# Patient Record
Sex: Male | Born: 1990 | Hispanic: No | Marital: Single | Smoking: Former smoker
Health system: Southern US, Community
[De-identification: ages and names within clinical notes are randomized; demographics above are authoritative.]

## PROBLEM LIST (undated history)

## (undated) DIAGNOSIS — F1010 Alcohol abuse, uncomplicated: Secondary | ICD-10-CM

## (undated) HISTORY — PX: EAR CANALOPLASTY: SHX1481

---
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
## 2014-01-20 ENCOUNTER — Emergency Department: Payer: Self-pay | Admitting: Emergency Medicine

## 2014-01-22 ENCOUNTER — Emergency Department: Payer: Self-pay | Admitting: Emergency Medicine

## 2014-01-25 ENCOUNTER — Emergency Department: Payer: Self-pay | Admitting: Emergency Medicine
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
## 2017-06-03 ENCOUNTER — Encounter: Payer: Self-pay | Admitting: Emergency Medicine

## 2017-06-03 ENCOUNTER — Emergency Department
Admission: EM | Admit: 2017-06-03 | Discharge: 2017-06-03 | Disposition: A | Discharge status: Home / Self Care | Payer: Self-pay | Attending: Emergency Medicine | Admitting: Emergency Medicine

## 2017-06-03 DIAGNOSIS — Y929 Unspecified place or not applicable: Secondary | ICD-10-CM | POA: Insufficient documentation

## 2017-06-03 DIAGNOSIS — R45851 Suicidal ideations: Secondary | ICD-10-CM

## 2017-06-03 DIAGNOSIS — T148XXA Other injury of unspecified body region, initial encounter: Secondary | ICD-10-CM

## 2017-06-03 DIAGNOSIS — F99 Mental disorder, not otherwise specified: Secondary | ICD-10-CM | POA: Insufficient documentation

## 2017-06-03 DIAGNOSIS — Y939 Activity, unspecified: Secondary | ICD-10-CM | POA: Insufficient documentation

## 2017-06-03 DIAGNOSIS — S0081XA Abrasion of other part of head, initial encounter: Secondary | ICD-10-CM | POA: Insufficient documentation

## 2017-06-03 DIAGNOSIS — Y999 Unspecified external cause status: Secondary | ICD-10-CM | POA: Insufficient documentation

## 2017-06-03 DIAGNOSIS — X58XXXA Exposure to other specified factors, initial encounter: Secondary | ICD-10-CM | POA: Insufficient documentation

## 2017-06-03 DIAGNOSIS — F17210 Nicotine dependence, cigarettes, uncomplicated: Secondary | ICD-10-CM | POA: Insufficient documentation

## 2017-06-03 LAB — CBC
HEMATOCRIT: 45.7 % (ref 40.0–52.0)
HEMOGLOBIN: 14.4 g/dL (ref 13.0–18.0)
MCH: 22.5 pg — ABNORMAL LOW (ref 26.0–34.0)
MCHC: 31.6 g/dL — AB (ref 32.0–36.0)
MCV: 71 fL — ABNORMAL LOW (ref 80.0–100.0)
Platelets: 154 10*3/uL (ref 150–440)
RBC: 6.43 MIL/uL — ABNORMAL HIGH (ref 4.40–5.90)
RDW: 14.9 % — ABNORMAL HIGH (ref 11.5–14.5)
WBC: 10.1 10*3/uL (ref 3.8–10.6)

## 2017-06-03 LAB — COMPREHENSIVE METABOLIC PANEL
ALBUMIN: 4.8 g/dL (ref 3.5–5.0)
ALT: 16 U/L — ABNORMAL LOW (ref 17–63)
ANION GAP: 4 — AB (ref 5–15)
AST: 20 U/L (ref 15–41)
Alkaline Phosphatase: 39 U/L (ref 38–126)
BUN: 18 mg/dL (ref 6–20)
CHLORIDE: 106 mmol/L (ref 101–111)
CO2: 26 mmol/L (ref 22–32)
Calcium: 9 mg/dL (ref 8.9–10.3)
Creatinine, Ser: 1.13 mg/dL (ref 0.61–1.24)
GFR calc non Af Amer: >60 mL/min (ref >60)
GLUCOSE: 110 mg/dL — AB (ref 65–99)
POTASSIUM: 4.2 mmol/L (ref 3.5–5.1)
SODIUM: 136 mmol/L (ref 135–145)
Total Bilirubin: 0.9 mg/dL (ref 0.3–1.2)
Total Protein: 8 g/dL (ref 6.5–8.1)

## 2017-06-03 LAB — URINE DRUG SCREEN, QUALITATIVE (ARMC ONLY)
AMPHETAMINES, UR SCREEN: NOT DETECTED
BARBITURATES, UR SCREEN: NOT DETECTED
BENZODIAZEPINE, UR SCRN: NOT DETECTED
COCAINE METABOLITE, UR ~~LOC~~: POSITIVE — AB
Cannabinoid 50 Ng, Ur ~~LOC~~: NOT DETECTED
MDMA (Ecstasy)Ur Screen: NOT DETECTED
METHADONE SCREEN, URINE: NOT DETECTED
OPIATE, UR SCREEN: NOT DETECTED
Phencyclidine (PCP) Ur S: NOT DETECTED
TRICYCLIC, UR SCREEN: NOT DETECTED

## 2017-06-03 LAB — ETHANOL: Alcohol, Ethyl (B): <5 mg/dL (ref <5)

## 2017-06-03 LAB — SALICYLATE LEVEL

## 2017-06-03 LAB — ACETAMINOPHEN LEVEL

## 2017-06-03 NOTE — ED Triage Notes (Addendum)
[facility]. Pt was a prisoner in jail and per the IVC paperwork pt was banging head on the door stating that he lost his son and was going to stop eating to kill himself. Pt has been in jail since Tuesday for damaging property.  Pt has hx of incarceration in the past.  Pt states he was banging his head on purpose today because he states he hated being in jail. At this time patient states he does not want to hurt himself.    Per the Officer that brought the patient to the jail the patient is being released from the jail so once the patient is discharged from the ED he is free to go home.
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
## 2017-06-03 NOTE — ED Notes (Signed)
Patient given meal tray and water. No further needs expressed at this time.

## 2017-06-03 NOTE — ED Notes (Signed)
Pt is alert and oriented on admission. Pt mood is sad but he is pleasant and cooperative with staff and appropriate on the unit. Pt denies SI/HI and AVH at this time. Pt states that he is glad to be out of jail and is looking forward to repairing his relationship with his girlfriend. SOC interviewed the patient and recommends discharge.Pt states that he will return to his parents house and is currently trying  to contact them for pick up this evening pending discharge. Nutrition provided and patient showered earlier as well. 15 minute checks are ongoing for safety.

## 2017-06-03 NOTE — ED Provider Notes (Addendum)
[facility] Emergency Department Provider Note  ____________________________________________   First MD Initiated Contact with Patient 06/03/17 1748     (approximate)  I have reviewed the triage vital signs and the nursing notes.   HISTORY  Chief Complaint Psychiatric Evaluation and Suicidal   HPI Warren Cherry is a 26 y.o. male who is presenting to the emergency department today after being brought in by 30s for banging his head against a wall in saying that he has nothing to live for. The patient admits to this but says that he was only saying that he had nothing to live for so that he will be taken as general population in the jail. He says that his son was recently taken by his "baby's mother." Patient denying any suicidal or homicidal ideation at this time.Sustain a small abrasion to his anterior and superior forehead. Denies any headache, nausea or vomiting or dizziness at this time. Says that his last tetanus shot was within the past 5 years. History reviewed. No pertinent past medical history.  There are no active problems to display for this patient.   History reviewed. No pertinent surgical history.  Prior to Admission medications   Not on File    Allergies Patient has no known allergies.  History reviewed. No pertinent family history.  Social History Social History  Substance Use Topics  . Smoking status: Current Every Day Smoker    Packs/day: 0.50    Types: Cigarettes  . Smokeless tobacco: Never Used  . Alcohol use Yes    Review of Systems  Constitutional: No fever/chills Eyes: No visual changes. ENT: No sore throat. Cardiovascular: Denies chest pain. Respiratory: Denies shortness of breath. Gastrointestinal: No abdominal pain.  No nausea, no vomiting.  No diarrhea.  No constipation. Genitourinary: Negative for dysuria. Musculoskeletal: Negative for back pain. Skin: Negative for rash. Neurological: Negative for headaches,  focal weakness or numbness.   ____________________________________________   PHYSICAL EXAM:  VITAL SIGNS: ED Triage Vitals [06/03/17 1737]  Enc Vitals Group     BP      Pulse      Resp      Temp      Temp src      SpO2      Weight      Height      Head Circumference      Peak Flow      Pain Score 0     Pain Loc      Pain Edu?      Excl. in GC?     Constitutional: Alert and oriented. Well appearing and in no acute distress. Eyes: Conjunctivae are normal.  Head: Superficial abrasion to the superior aspect of the forehead at the midline. No active bleeding at this time. Abrasion is about 2 x 3 cm. Nose: No congestion/rhinnorhea. Mouth/Throat: Mucous membranes are moist.  Neck: No stridor.   Cardiovascular: Normal rate, regular rhythm. Grossly normal heart sounds.  Good peripheral circulation. Respiratory: Normal respiratory effort.  No retractions. Lungs CTAB. Gastrointestinal: Soft and nontender. No distention.  Musculoskeletal: No lower extremity tenderness nor edema.  No joint effusions. Neurologic:  Normal speech and language. No gross focal neurologic deficits are appreciated. Skin:  Skin is warm, dry and intact. No rash noted. Psychiatric: Mood and affect are normal. Speech and behavior are normal.  ____________________________________________   LABS (all labs ordered are listed, but only abnormal results are displayed)  Labs Reviewed  COMPREHENSIVE METABOLIC PANEL - Abnormal; Notable for the  following:       Result Value   Glucose, Bld 110 (*)    ALT 16 (*)    Anion gap 4 (*)    All other components within normal limits  ACETAMINOPHEN LEVEL - Abnormal; Notable for the following:    Acetaminophen (Tylenol), Serum <10 (*)    All other components within normal limits  CBC - Abnormal; Notable for the following:    RBC 6.43 (*)    MCV 71.0 (*)    MCH 22.5 (*)    MCHC 31.6 (*)    RDW 14.9 (*)    All other components within normal limits  URINE DRUG SCREEN,  QUALITATIVE ([facility] ONLY) - Abnormal; Notable for the following:    Cocaine Metabolite,Ur [place] POSITIVE (*)    All other components within normal limits  ETHANOL  SALICYLATE LEVEL   ____________________________________________  EKG   ____________________________________________  RADIOLOGY   ____________________________________________   PROCEDURES  Procedure(s) performed:   Procedures  Critical Care performed:   ____________________________________________   INITIAL IMPRESSION / ASSESSMENT AND PLAN / ED COURSE  Pertinent labs & imaging results that were available during my care of the patient were reviewed by me and considered in my medical decision making (see chart for details).  Patient, cooperative at this time. I we'll uphold the patient's involuntary committed. To be seen by the specialist on-call psychiatrist. The patient is understanding of the plan and willing to comply.    ----------------------------------------- 10:51 PM on 06/03/2017 -----------------------------------------  Patient evaluated by specialist on call and deemed appropriate for discharge. The patient has been calm and cooperative while in the emergency department. Patient not in police custody. Cocaine positive the patient is not intoxicated clinically.  ____________________________________________   FINAL CLINICAL IMPRESSION(S) / ED DIAGNOSES  Abrasion. Suicidal ideation.    NEW MEDICATIONS STARTED DURING THIS VISIT:  New Prescriptions   No medications on file     Note:  This document was prepared using Dragon voice recognition software and may include unintentional dictation errors.     Myrna Blazer, MD 06/03/17 1936    Myrna Blazer, MD 06/03/17 2252  No further expression of any suicidal or homicidal ideation.   Myrna Blazer, MD 06/03/17 2252

## 2019-05-29 ENCOUNTER — Other Ambulatory Visit
Admit: 2019-05-29 | Discharge: 2019-05-29 | Disposition: A | Discharge status: Home / Self Care | Attending: Family Medicine | Admitting: Family Medicine
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
## 2019-10-15 ENCOUNTER — Other Ambulatory Visit: Payer: Self-pay

## 2019-10-15 ENCOUNTER — Emergency Department
Admission: EM | Admit: 2019-10-15 | Discharge: 2019-10-15 | Disposition: A | Discharge status: Home / Self Care | Attending: Emergency Medicine | Admitting: Emergency Medicine

## 2019-10-15 DIAGNOSIS — H65112 Acute and subacute allergic otitis media (mucoid) (sanguinous) (serous), left ear: Secondary | ICD-10-CM | POA: Insufficient documentation

## 2019-10-15 DIAGNOSIS — Z20822 Contact with and (suspected) exposure to covid-19: Secondary | ICD-10-CM

## 2019-10-15 DIAGNOSIS — F17210 Nicotine dependence, cigarettes, uncomplicated: Secondary | ICD-10-CM | POA: Insufficient documentation

## 2019-10-15 DIAGNOSIS — H6500 Acute serous otitis media, unspecified ear: Secondary | ICD-10-CM

## 2019-10-15 MED ORDER — AMOXICILLIN 500 MG PO CAPS: 500.0000 mg | ORAL_CAPSULE | Freq: Three times a day (TID) | ORAL | 0 refills | Status: DC

## 2019-10-15 NOTE — ED Triage Notes (Signed)
Pt c/o body aches, HA,chills for the past 2 days.

## 2019-10-15 NOTE — Discharge Instructions (Signed)
Follow-up with your regular doctor if not better in 3 days.  Return emergency department worsening.  Your Covid results should return within 48 hours.  Stay quarantined until you have received your results.  If negative you may return to your daily activities the next day.  If positive you must quarantine for an additional 10 days.

## 2019-10-15 NOTE — ED Notes (Addendum)
See triage note  Presents with h/a and body aches  States sxs' started 2 days ago   Afebrile on arrival

## 2019-10-15 NOTE — ED Provider Notes (Signed)
[facility] Emergency Department Provider Note  ____________________________________________   First MD Initiated Contact with Patient 10/15/19 1722     (approximate)  I have reviewed the triage vital signs and the nursing notes.   HISTORY  Chief Complaint URI    HPI Warren Cherry is a 28 y.o. male presents emergency department complaint headache, body aches for 2 days.  He is also complaining of  left ear pain.  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  States he feels like his become infected.  Unsure if he has been exposed to Covid.  Other family members are here with same symptoms.  He denies any chest pain, shortness of breath, abdominal pain.   History reviewed. No pertinent past medical history.  There are no active problems to display for this patient.   Past Surgical History:  Procedure Laterality Date  . EAR CANALOPLASTY      Prior to Admission medications   Medication Sig Start Date End Date Taking? Authorizing Provider  amoxicillin (AMOXIL) 500 MG capsule Take 1 capsule (500 mg total) by mouth 3 (three) times daily. 10/15/19   Versie Starks, PA-C    Allergies Patient has no known allergies.  No family history on file.  Social History Social History   Tobacco Use  . Smoking status: Current Every Day Smoker    Packs/day: 0.50    Types: Cigarettes  . Smokeless tobacco: Never Used  Substance Use Topics  . Alcohol use: Yes  . Drug use: No    Review of Systems  Constitutional: Positive fever/chills and body aches Eyes: No visual changes. ENT: No sore throat.  Positive for left ear pain Respiratory: Denies cough Genitourinary: Negative for dysuria. Musculoskeletal: Negative for back pain. Skin: Negative for rash.    ____________________________________________   PHYSICAL EXAM:  VITAL SIGNS: ED Triage Vitals  Enc Vitals Group     BP 10/15/19 1704 140/82     Pulse Rate 10/15/19  1704 94     Resp 10/15/19 1704 16     Temp 10/15/19 1704 98.9 F (37.2 C)     Temp Source 10/15/19 1704 Oral     SpO2 10/15/19 1704 98 %     Weight 10/15/19 1706 140 lb (63.5 kg)     Height 10/15/19 1706 5\' 6"  (1.676 m)     Head Circumference --      Peak Flow --      Pain Score 10/15/19 1705 10     Pain Loc --      Pain Edu? --      Excl. in [place]? --     Constitutional: Alert and oriented. Well appearing and in no acute distress. Eyes: Conjunctivae are normal.  Head: Atraumatic. Ears: Left TM is red Nose: No congestion/rhinnorhea. Mouth/Throat: Mucous membranes are moist.   Neck:  supple no lymphadenopathy noted Cardiovascular: Normal rate, regular rhythm. Heart sounds are normal Respiratory: Normal respiratory effort.  No retractions, lungs c t a  GU: deferred Musculoskeletal: FROM all extremities, warm and well perfused Neurologic:  Normal speech and language.  Skin:  Skin is warm, dry and intact. No rash noted. Psychiatric: Mood and affect are normal. Speech and behavior are normal.  ____________________________________________   LABS (all labs ordered are listed, but only abnormal results are displayed)  Labs Reviewed  SARS CORONAVIRUS 2 (TAT 6-24 HRS)   ____________________________________________   ____________________________________________  RADIOLOGY    ____________________________________________   PROCEDURES  Procedure(s) performed:  No  Procedures    ____________________________________________   INITIAL IMPRESSION / ASSESSMENT AND PLAN / ED COURSE  Pertinent labs & imaging results that were available during my care of the patient were reviewed by me and considered in my medical decision making (see chart for details).   Patient is a 28 year old male presents emergency department with complaints of left ear pain, fever chills and body aches.  Physical exam shows a well adult.  Left TM is red.  Explained findings to the patient.  He was  given a prescription for amoxicillin for the left ear.  However still did a Covid test as multiple family members have same symptoms.  He was given discharge instructions and told to quarantine until Covid results have been given to him.  He was discharged in stable condition.    Warren Cherry was evaluated in Emergency Department on 10/15/2019 for the symptoms described in the history of present illness. He was evaluated in the context of the global COVID-19 pandemic, which necessitated consideration that the patient might be at risk for infection with the SARS-CoV-2 virus that causes COVID-19. Institutional protocols and algorithms that pertain to the evaluation of patients at risk for COVID-19 are in a state of rapid change based on information released by regulatory bodies including the CDC and federal and state organizations. These policies and algorithms were followed during the patient's care in the ED.   As part of my medical decision making, I reviewed the following data within the electronic MEDICAL RECORD NUMBER Nursing notes reviewed and incorporated, Old chart reviewed, Notes from prior ED visits and [place] Controlled Substance Database  ____________________________________________   FINAL CLINICAL IMPRESSION(S) / ED DIAGNOSES  Final diagnoses:  Suspected COVID-19 virus infection  Acute serous otitis media, recurrence not specified, unspecified laterality      NEW MEDICATIONS STARTED DURING THIS VISIT:  New Prescriptions   AMOXICILLIN (AMOXIL) 500 MG CAPSULE    Take 1 capsule (500 mg total) by mouth 3 (three) times daily.     Note:  This document was prepared using Dragon voice recognition software and may include unintentional dictation errors.    Faythe Ghee, PA-C 10/15/19 Johna Sheriff, MD 10/18/19 [phone]

## 2019-10-18 NOTE — ED Notes (Signed)
Called pt to

## 2022-09-12 ENCOUNTER — Encounter: Payer: Self-pay | Admitting: Emergency Medicine

## 2022-09-12 ENCOUNTER — Ambulatory Visit
Admission: EM | Admit: 2022-09-12 | Discharge: 2022-09-12 | Disposition: A | Discharge status: Home / Self Care | Payer: Self-pay | Attending: Physician Assistant | Admitting: Physician Assistant

## 2022-09-12 DIAGNOSIS — H6002 Abscess of left external ear: Secondary | ICD-10-CM

## 2022-09-12 MED ORDER — DOXYCYCLINE HYCLATE 100 MG PO CAPS: 100.0000 mg | ORAL_CAPSULE | Freq: Two times a day (BID) | ORAL | 0 refills | Status: AC

## 2022-09-12 NOTE — Discharge Instructions (Signed)
-  I have drained the abscess of your earlobe.  Make sure you change your bandage later today.  I expect that you may still experience more drainage from the area over the next day or 2.  Change your bandage daily and clean the area with soap and water.  May be able to cover with a regular Band-Aid tomorrow. - I sent oral antibiotics to pharmacy to clear up the remainder of the infection.  If you develop a fever or your symptoms worsen or there are not improved over the next couple days you should return for reevaluation.

## 2022-09-12 NOTE — ED Provider Notes (Signed)
[facility]    CSN: [id number] Arrival date & time: 09/12/22  1130      History   Chief Complaint Chief Complaint  Patient presents with   Abscess    HPI Warren Cherry is a 31 y.o. male presenting for an area of swelling and redness as well as pain behind the left ear for the past 3 to 4 days.  He reports some swelling in his jaw as well.  He denies any fevers or fatigue.  He has not had any drainage from the area but says that it smells.  He has never had the like this before.  He says only his external ear is painful.  He denies any earrings or piercings.  Has not been treating condition in any way and it continues to enlarge.  HPI  History reviewed. No pertinent past medical history.  There are no problems to display for this patient.   Past Surgical History:  Procedure Laterality Date   EAR CANALOPLASTY         Home Medications    Prior to Admission medications   Medication Sig Start Date End Date Taking? Authorizing Provider  doxycycline (VIBRAMYCIN) 100 MG capsule Take 1 capsule (100 mg total) by mouth 2 (two) times daily for 7 days. 09/12/22 09/19/22 Yes Shirlee Latch, PA-C    Family History No family history on file.  Social History Social History   Tobacco Use   Smoking status: Former    Packs/day: 0.50    Types: Cigarettes   Smokeless tobacco: Never  Vaping Use   Vaping Use: Every day  Substance Use Topics   Alcohol use: Yes   Drug use: No     Allergies   Patient has no known allergies.   Review of Systems Review of Systems  Constitutional:  Negative for fatigue and fever.  HENT:  Positive for ear pain and facial swelling. Negative for ear discharge and hearing loss.   Skin:  Positive for color change. Negative for wound.  Neurological:  Negative for weakness and headaches.     Physical Exam Triage Vital Signs ED Triage Vitals  Enc Vitals Group     BP 09/12/22 1204 136/87     Pulse Rate 09/12/22 1204 75     Resp  09/12/22 1204 16     Temp 09/12/22 1204 98.6 F (37 C)     Temp Source 09/12/22 1204 Oral     SpO2 09/12/22 1204 98 %     Weight 09/12/22 1203 139 lb 15.9 oz (63.5 kg)     Height 09/12/22 1203 5\' 6"  (1.676 m)     Head Circumference --      Peak Flow --      Pain Score 09/12/22 1202 6     Pain Loc --      Pain Edu? --      Excl. in GC? --    No data found.  Updated Vital Signs BP 136/87 (BP Location: Right Arm)   Pulse 75   Temp 98.6 F (37 C) (Oral)   Resp 16   Ht 5\' 6"  (1.676 m)   Wt 139 lb 15.9 oz (63.5 kg)   SpO2 98%   BMI 22.60 kg/m     Physical Exam Vitals and nursing note reviewed.  Constitutional:      General: He is not in acute distress.    Appearance: Normal appearance. He is well-developed. He is not ill-appearing.  HENT:     Head:  Normocephalic and atraumatic.     Right Ear: Tympanic membrane, ear canal and external ear normal.     Left Ear: Tympanic membrane and ear canal normal.     Ears:     Comments: See image below.  There is an area of erythema edema, swelling, induration and fluctuance of the left earlobe and posterior to that.  It is approximately 2 cm x 2 cm.    Nose: Nose normal.     Mouth/Throat:     Mouth: Mucous membranes are moist.     Pharynx: Oropharynx is clear.  Eyes:     General: No scleral icterus.    Conjunctiva/sclera: Conjunctivae normal.  Cardiovascular:     Rate and Rhythm: Normal rate and regular rhythm.     Heart sounds: Normal heart sounds.  Pulmonary:     Effort: Pulmonary effort is normal. No respiratory distress.     Breath sounds: Normal breath sounds.  Musculoskeletal:     Cervical back: Neck supple.  Skin:    General: Skin is warm and dry.     Capillary Refill: Capillary refill takes less than 2 seconds.  Neurological:     General: No focal deficit present.     Mental Status: He is alert. Mental status is at baseline.  Psychiatric:        Mood and Affect: Mood normal.        Behavior: Behavior normal.        UC Treatments / Results  Labs (all labs ordered are listed, but only abnormal results are displayed) Labs Reviewed - No data to display  EKG   Radiology No results found.  Procedures Incision and Drainage  Date/Time: 09/12/2022 1:12 PM  Performed by: Danton Clap, PA-C Authorized by: Danton Clap, PA-C   Consent:    Consent obtained:  Verbal   Consent given by:  Patient   Risks discussed:  Bleeding, infection, incomplete drainage and pain   Alternatives discussed:  Alternative treatment, delayed treatment and observation Location:    Type:  Abscess   Size:  2 cm x 2 cm   Location:  Head   Head location:  L external ear Pre-procedure details:    Skin preparation:  Povidone-iodine Anesthesia:    Anesthesia method:  Local infiltration   Local anesthetic:  Lidocaine 1% w/o epi Procedure type:    Complexity:  Simple Procedure details:    Incision types:  Stab incision   Drainage:  Purulent   Drainage amount:  Moderate   Wound treatment:  Wound left open Post-procedure details:    Procedure completion:  Tolerated well, no immediate complications  (including critical care time)  Medications Ordered in UC Medications - No data to display  Initial Impression / Assessment and Plan / UC Course  I have reviewed the triage vital signs and the nursing notes.  Pertinent labs & imaging results that were available during my care of the patient were reviewed by me and considered in my medical decision making (see chart for details).   31 year old male presents for abscess of the left earlobe.  I have included an image of the abscess which is about 2 cm x 2 cm.  There is also a little bit of swelling posterior to the ear which is consistent with posterior auricular enlarged lymph node.  The area is diffusely tender.  He has mild swelling of the left jaw.  Unable to fully open mouth.  No fevers.  He agrees to I&D of the  abscess.  I was able to express a moderate  amount of foul-smelling purulent material.  Cleaned the area bandaged with gauze.  He tolerated this very well.  Sent doxycycline 100 mg twice daily x7 days to pharmacy.  Discussed wound care.  Reviewed return precautions.   Final Clinical Impressions(s) / UC Diagnoses   Final diagnoses:  Abscess of left earlobe     Discharge Instructions      -I have drained the abscess of your earlobe.  Make sure you change your bandage later today.  I expect that you may still experience more drainage from the area over the next day or 2.  Change your bandage daily and clean the area with soap and water.  May be able to cover with a regular Band-Aid tomorrow. - I sent oral antibiotics to pharmacy to clear up the remainder of the infection.  If you develop a fever or your symptoms worsen or there are not improved over the next couple days you should return for reevaluation.     ED Prescriptions     Medication Sig Dispense Auth. Provider   doxycycline (VIBRAMYCIN) 100 MG capsule Take 1 capsule (100 mg total) by mouth 2 (two) times daily for 7 days. 14 capsule Shirlee Latch, PA-C      PDMP not reviewed this encounter.   Shirlee Latch, PA-C 09/12/22 1314

## 2022-09-12 NOTE — ED Triage Notes (Addendum)
Pt has abscess on his left ear. He states he noticed it earlier this week. He also has swelling of his left jaw area. No drainage. He states he also has a headache. Denies fever.

## 2023-09-12 ENCOUNTER — Inpatient Hospital Stay
Admission: AD | Admit: 2023-09-12 | Discharge: 2023-09-16 | DRG: 897 | Disposition: A | Discharge status: Home / Self Care | Payer: BC Managed Care – PPO | Source: Intra-hospital | Attending: Psychiatry | Admitting: Psychiatry

## 2023-09-12 ENCOUNTER — Encounter (HOSPITAL_COMMUNITY): Payer: Self-pay | Admitting: *Deleted

## 2023-09-12 ENCOUNTER — Encounter: Payer: Self-pay | Admitting: Nurse Practitioner

## 2023-09-12 ENCOUNTER — Emergency Department (HOSPITAL_COMMUNITY)
Admission: EM | Admit: 2023-09-12 | Discharge: 2023-09-12 | Disposition: A | Discharge status: Other Acute Inpatient Hospital | Payer: BC Managed Care – PPO | Attending: Emergency Medicine | Admitting: Emergency Medicine

## 2023-09-12 ENCOUNTER — Other Ambulatory Visit: Payer: Self-pay

## 2023-09-12 DIAGNOSIS — G4700 Insomnia, unspecified: Secondary | ICD-10-CM | POA: Diagnosis not present

## 2023-09-12 DIAGNOSIS — F418 Other specified anxiety disorders: Secondary | ICD-10-CM | POA: Insufficient documentation

## 2023-09-12 DIAGNOSIS — Z8249 Family history of ischemic heart disease and other diseases of the circulatory system: Secondary | ICD-10-CM

## 2023-09-12 DIAGNOSIS — R45851 Suicidal ideations: Secondary | ICD-10-CM | POA: Diagnosis present

## 2023-09-12 DIAGNOSIS — F4310 Post-traumatic stress disorder, unspecified: Secondary | ICD-10-CM | POA: Diagnosis present

## 2023-09-12 DIAGNOSIS — F1410 Cocaine abuse, uncomplicated: Secondary | ICD-10-CM | POA: Diagnosis present

## 2023-09-12 DIAGNOSIS — R7989 Other specified abnormal findings of blood chemistry: Secondary | ICD-10-CM | POA: Diagnosis present

## 2023-09-12 DIAGNOSIS — R4589 Other symptoms and signs involving emotional state: Secondary | ICD-10-CM | POA: Diagnosis present

## 2023-09-12 DIAGNOSIS — R040 Epistaxis: Secondary | ICD-10-CM | POA: Diagnosis present

## 2023-09-12 DIAGNOSIS — R45850 Homicidal ideations: Secondary | ICD-10-CM

## 2023-09-12 DIAGNOSIS — Z87891 Personal history of nicotine dependence: Secondary | ICD-10-CM | POA: Diagnosis not present

## 2023-09-12 DIAGNOSIS — E871 Hypo-osmolality and hyponatremia: Secondary | ICD-10-CM | POA: Diagnosis not present

## 2023-09-12 DIAGNOSIS — R000 Tachycardia, unspecified: Secondary | ICD-10-CM | POA: Insufficient documentation

## 2023-09-12 DIAGNOSIS — Y900 Blood alcohol level of less than 20 mg/100 ml: Secondary | ICD-10-CM | POA: Diagnosis not present

## 2023-09-12 DIAGNOSIS — F419 Anxiety disorder, unspecified: Secondary | ICD-10-CM | POA: Diagnosis present

## 2023-09-12 DIAGNOSIS — Z79899 Other long term (current) drug therapy: Secondary | ICD-10-CM | POA: Diagnosis not present

## 2023-09-12 DIAGNOSIS — F1020 Alcohol dependence, uncomplicated: Principal | ICD-10-CM | POA: Diagnosis present

## 2023-09-12 DIAGNOSIS — F17290 Nicotine dependence, other tobacco product, uncomplicated: Secondary | ICD-10-CM | POA: Diagnosis present

## 2023-09-12 DIAGNOSIS — F1910 Other psychoactive substance abuse, uncomplicated: Secondary | ICD-10-CM | POA: Diagnosis not present

## 2023-09-12 DIAGNOSIS — R4587 Impulsiveness: Secondary | ICD-10-CM | POA: Diagnosis not present

## 2023-09-12 DIAGNOSIS — F10239 Alcohol dependence with withdrawal, unspecified: Secondary | ICD-10-CM | POA: Diagnosis present

## 2023-09-12 DIAGNOSIS — F10288 Alcohol dependence with other alcohol-induced disorder: Secondary | ICD-10-CM | POA: Diagnosis present

## 2023-09-12 DIAGNOSIS — F4312 Post-traumatic stress disorder, chronic: Secondary | ICD-10-CM | POA: Insufficient documentation

## 2023-09-12 DIAGNOSIS — R7401 Elevation of levels of liver transaminase levels: Secondary | ICD-10-CM | POA: Diagnosis not present

## 2023-09-12 DIAGNOSIS — Z91048 Other nonmedicinal substance allergy status: Secondary | ICD-10-CM | POA: Diagnosis not present

## 2023-09-12 DIAGNOSIS — F1010 Alcohol abuse, uncomplicated: Secondary | ICD-10-CM

## 2023-09-12 DIAGNOSIS — D72829 Elevated white blood cell count, unspecified: Secondary | ICD-10-CM | POA: Diagnosis present

## 2023-09-12 DIAGNOSIS — R030 Elevated blood-pressure reading, without diagnosis of hypertension: Secondary | ICD-10-CM | POA: Diagnosis present

## 2023-09-12 HISTORY — DX: Alcohol abuse, uncomplicated: F10.10

## 2023-09-12 LAB — COMPREHENSIVE METABOLIC PANEL
ALT: 33 U/L (ref 0–44)
AST: 78 U/L — ABNORMAL HIGH (ref 15–41)
Albumin: 4.4 g/dL (ref 3.5–5.0)
Alkaline Phosphatase: 32 U/L — ABNORMAL LOW (ref 38–126)
Anion gap: 14 (ref 5–15)
BUN: 21 mg/dL — ABNORMAL HIGH (ref 6–20)
CO2: 20 mmol/L — ABNORMAL LOW (ref 22–32)
Calcium: 9.2 mg/dL (ref 8.9–10.3)
Chloride: 98 mmol/L (ref 98–111)
Creatinine, Ser: 0.99 mg/dL (ref 0.61–1.24)
GFR, Estimated: >60 mL/min (ref >60)
Glucose, Bld: 148 mg/dL — ABNORMAL HIGH (ref 70–99)
Potassium: 4.6 mmol/L (ref 3.5–5.1)
Sodium: 132 mmol/L — ABNORMAL LOW (ref 135–145)
Total Bilirubin: 1.4 mg/dL — ABNORMAL HIGH (ref 0.3–1.2)
Total Protein: 8.2 g/dL — ABNORMAL HIGH (ref 6.5–8.1)

## 2023-09-12 LAB — CBC
HCT: 47.2 % (ref 39.0–52.0)
Hemoglobin: 15.1 g/dL (ref 13.0–17.0)
MCH: 23.1 pg — ABNORMAL LOW (ref 26.0–34.0)
MCHC: 32 g/dL (ref 30.0–36.0)
MCV: 72.1 fL — ABNORMAL LOW (ref 80.0–100.0)
Platelets: 292 10*3/uL (ref 150–400)
RBC: 6.55 MIL/uL — ABNORMAL HIGH (ref 4.22–5.81)
RDW: 16.2 % — ABNORMAL HIGH (ref 11.5–15.5)
WBC: 13.4 10*3/uL — ABNORMAL HIGH (ref 4.0–10.5)
nRBC: 0 % (ref 0.0–0.2)

## 2023-09-12 LAB — ETHANOL: Alcohol, Ethyl (B): <10 mg/dL (ref <10)

## 2023-09-12 LAB — RAPID URINE DRUG SCREEN, HOSP PERFORMED
Amphetamines: NOT DETECTED
Barbiturates: NOT DETECTED
Benzodiazepines: NOT DETECTED
Cocaine: POSITIVE — AB
Opiates: NOT DETECTED
Tetrahydrocannabinol: NOT DETECTED

## 2023-09-12 LAB — ACETAMINOPHEN LEVEL: Acetaminophen (Tylenol), Serum: <10 ug/mL — ABNORMAL LOW (ref 10–30)

## 2023-09-12 LAB — SALICYLATE LEVEL: Salicylate Lvl: <7 mg/dL — ABNORMAL LOW (ref 7.0–30.0)

## 2023-09-12 MED ORDER — HALOPERIDOL LACTATE 5 MG/ML IJ SOLN: 5.0000 mg | Freq: Three times a day (TID) | INTRAMUSCULAR | Status: DC | PRN

## 2023-09-12 MED ORDER — THIAMINE HCL 100 MG/ML IJ SOLN: 100.0000 mg | Freq: Every day | INTRAMUSCULAR | Status: DC

## 2023-09-12 MED ORDER — LORAZEPAM 2 MG/ML IJ SOLN: 1.0000 mg | INTRAMUSCULAR | Status: DC | PRN

## 2023-09-12 MED ORDER — LORAZEPAM 1 MG PO TABS
0.0000 mg | ORAL_TABLET | Freq: Four times a day (QID) | ORAL | Status: DC
Start: 2023-09-12 — End: 2023-09-12
  Administered 2023-09-12: 1 mg via ORAL
  Filled 2023-09-12: qty 1

## 2023-09-12 MED ORDER — LORAZEPAM 2 MG PO TABS
2.0000 mg | ORAL_TABLET | Freq: Three times a day (TID) | ORAL | Status: DC | PRN
Start: 2023-09-12 — End: 2023-09-13

## 2023-09-12 MED ORDER — ADULT MULTIVITAMIN W/MINERALS CH
1.0000 | ORAL_TABLET | Freq: Every day | ORAL | Status: DC
  Administered 2023-09-12: 1 via ORAL
  Filled 2023-09-12: qty 1

## 2023-09-12 MED ORDER — ADULT MULTIVITAMIN W/MINERALS CH
1.0000 | ORAL_TABLET | Freq: Every day | ORAL | Status: DC
  Administered 2023-09-13: 1 via ORAL
  Filled 2023-09-12: qty 1

## 2023-09-12 MED ORDER — HALOPERIDOL 5 MG PO TABS: 5.0000 mg | ORAL_TABLET | Freq: Three times a day (TID) | ORAL | Status: DC | PRN

## 2023-09-12 MED ORDER — ACETAMINOPHEN 325 MG PO TABS
650.0000 mg | ORAL_TABLET | Freq: Four times a day (QID) | ORAL | Status: DC | PRN
Start: 2023-09-12 — End: 2023-09-13

## 2023-09-12 MED ORDER — MAGNESIUM HYDROXIDE 400 MG/5ML PO SUSP: 30.0000 mL | Freq: Every day | ORAL | Status: DC | PRN

## 2023-09-12 MED ORDER — LORAZEPAM 1 MG PO TABS: 1.0000 mg | ORAL_TABLET | ORAL | Status: DC | PRN

## 2023-09-12 MED ORDER — LORAZEPAM 1 MG PO TABS: 0.0000 mg | ORAL_TABLET | Freq: Two times a day (BID) | ORAL | Status: DC

## 2023-09-12 MED ORDER — DIPHENHYDRAMINE HCL 25 MG PO CAPS
50.0000 mg | ORAL_CAPSULE | Freq: Three times a day (TID) | ORAL | Status: DC | PRN
Start: 2023-09-12 — End: 2023-09-13

## 2023-09-12 MED ORDER — DIPHENHYDRAMINE HCL 50 MG/ML IJ SOLN
50.0000 mg | Freq: Three times a day (TID) | INTRAMUSCULAR | Status: DC | PRN
Start: 2023-09-12 — End: 2023-09-13

## 2023-09-12 MED ORDER — LORAZEPAM 2 MG PO TABS
0.0000 mg | ORAL_TABLET | Freq: Two times a day (BID) | ORAL | Status: DC
Start: 2023-09-14 — End: 2023-09-13

## 2023-09-12 MED ORDER — THIAMINE MONONITRATE 100 MG PO TABS
100.0000 mg | ORAL_TABLET | Freq: Every day | ORAL | Status: DC
  Administered 2023-09-12: 100 mg via ORAL
  Filled 2023-09-12: qty 1

## 2023-09-12 MED ORDER — LORAZEPAM 2 MG PO TABS
0.0000 mg | ORAL_TABLET | Freq: Four times a day (QID) | ORAL | Status: DC
Start: 2023-09-12 — End: 2023-09-13

## 2023-09-12 MED ORDER — FOLIC ACID 1 MG PO TABS
1.0000 mg | ORAL_TABLET | Freq: Every day | ORAL | Status: DC
  Administered 2023-09-13 – 2023-09-16 (×4): 1 mg via ORAL
  Filled 2023-09-12 (×4): qty 1

## 2023-09-12 MED ORDER — THIAMINE MONONITRATE 100 MG PO TABS
100.0000 mg | ORAL_TABLET | Freq: Every day | ORAL | Status: DC
  Administered 2023-09-13: 100 mg via ORAL
  Filled 2023-09-12: qty 1

## 2023-09-12 MED ORDER — LORAZEPAM 2 MG/ML IJ SOLN
2.0000 mg | Freq: Three times a day (TID) | INTRAMUSCULAR | Status: DC | PRN
Start: 2023-09-12 — End: 2023-09-13

## 2023-09-12 MED ORDER — ALUM & MAG HYDROXIDE-SIMETH 200-200-20 MG/5ML PO SUSP: 30.0000 mL | ORAL | Status: DC | PRN

## 2023-09-12 MED ORDER — FOLIC ACID 1 MG PO TABS
1.0000 mg | ORAL_TABLET | Freq: Every day | ORAL | Status: DC
  Administered 2023-09-12: 1 mg via ORAL
  Filled 2023-09-12: qty 1

## 2023-09-12 NOTE — Consult Note (Cosign Needed Addendum)
BH ED ASSESSMENT   Reason for Consult:  Psychiatry evaluation Referring Physician:  ER Physician Patient Identification: Warren Cherry MRN:  [id number] ED Chief Complaint: Alcohol use disorder, severe, dependence (HCC)  Diagnosis:  Principal Problem:   Alcohol use disorder, severe, dependence (HCC) Active Problems:   Chronic post-traumatic stress disorder (PTSD)   ED Assessment Time Calculation: Start Time: 1308 Stop Time: 1337 Total Time in Minutes (Assessment Completion): 29   Subjective:   Warren Cherry is a 32 y.o. male patient admitted with no documented Mental illness but has been an alcoholic since age 74  brought in to the ER accompanied by his Girl friend for Alcohol intoxication, SI/HI.  HPI:  Patient was seen this afternoon awake, disheveled but engaged in meaningful conversation.  Patient reports that he was drinking with friends last night and got drunk.  He said he became suicidal and threatened to harm his GF.  Patient admits that he started drinking alcohol back at age 1-15.  He says he picked up drinking more as an adult.  He reports daily drinking after work between 12-18 bottles of beer daily.  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  Between the hours of 6 pm after daily work till 2 am he is drinking.  He described himself as a "working alcoholic"  Patient denies alcohol withdrawal seizures and added that he drinks until he passes out and falls asleep.  His last drink was this morning he says.  He does not remember threatening his GF or endorsing suicide.  Patient said he woke up drunk and called his boss crying that he is about to loose his GF and that he need help with his alcoholism.  He has never been to any Detox treatment and has never been sober before.  AST is 78,  Bilirubin is 1.74 Male, 32 years old seen this afternoon for SI/HI after he became intoxicated last night.  His GF threatened to leave him this time and he decided to seek help.  Patient admits driving occasionally while drunk.  Patient now denies SI/HI/AVH.  He meets criteria for inpatient Alcohol detox treatment.  He is placed on CIWA protocol with Ativan coverage.  His plan is to continue treatment in outpatient Alcohol treatment after hospitalization.    We will fax out records to facilities seeking bed placement.   See collateral information from his GF written down by Jacquelynn Cree, [facility]    Past Psychiatric History: NONE  Risk to Self or Others: Is the patient at risk to self? No Has the patient been a risk to self in the past 6 months? No Has the patient been a risk to self within the distant past? No Is the patient a risk to others? No Has the patient been a risk to others in the past 6 months? No Has the patient been a risk to others within the distant past? No  [place] Scale:  Flowsheet Row ED from 09/12/2023 in [facility] Emergency Department at [facility] ED from 09/12/2022 in [facility] at [facility]   C-SSRS RISK CATEGORY High Risk No Risk       AIMS:  , , ,  ,   ASAM:    Substance Abuse:  Past Medical History: No past medical history on file.  Past Surgical History:  Procedure Laterality Date   EAR CANALOPLASTY     Family History: No family history on file. Family Psychiatric  History: Denies Social History:  Social History   Substance and Sexual Activity  Alcohol Use Yes     Social History   Substance and Sexual Activity  Drug Use No    Social History   Socioeconomic History   Marital status: Single    Spouse name: Not on file   Number of children: Not on file   Years of education: Not on file   Highest education level: Not on file  Occupational History   Not on file  Tobacco Use   Smoking status: Former    Current  packs/day: 0.50    Types: Cigarettes   Smokeless tobacco: Never  Vaping Use   Vaping status: Every Day  Substance and Sexual Activity   Alcohol use: Yes   Drug use: No   Sexual activity: Not on file  Other Topics Concern   Not on file  Social History Narrative   Not on file   Social Determinants of Health   Financial Resource Strain: Not on file  Food Insecurity: Not on file  Transportation Needs: Not on file  Physical Activity: Not on file  Stress: Not on file  Social Connections: Not on file   Additional Social History:    Allergies:   Allergies  Allergen Reactions   Gramineae Pollens Other (See Comments)    Unknown     Labs:  Results for orders placed or performed during the hospital encounter of 09/12/23 (from the past 48 hour(s))  Comprehensive metabolic panel     Status: Abnormal   Collection Time: 09/12/23 10:25 AM  Result Value Ref Range   Sodium 132 (L) 135 - 145 mmol/L   Potassium 4.6 3.5 - 5.1 mmol/L   Chloride 98 98 - 111 mmol/L   CO2 20 (L) 22 - 32 mmol/L   Glucose, Bld 148 (H) 70 - 99 mg/dL    Comment: Glucose reference range applies only to samples taken after fasting for at least 8 hours.   BUN 21 (H) 6 - 20 mg/dL   Creatinine, Ser 4.78 0.61 - 1.24 mg/dL   Calcium 9.2 8.9 - 29.5 mg/dL   Total Protein 8.2 (H) 6.5 - 8.1 g/dL   Albumin 4.4 3.5 - 5.0 g/dL   AST 78 (H) 15 - 41 U/L   ALT 33 0 - 44 U/L   Alkaline Phosphatase 32 (L) 38 - 126 U/L   Total Bilirubin 1.4 (H) 0.3 - 1.2 mg/dL   GFR, Estimated >62 >13 mL/min    Comment: (NOTE) Calculated using the CKD-EPI Creatinine Equation (2021)    Anion gap 14 5 - 15    Comment: Performed at [facility] [address], [place], [place] [address]  Salicylate level     Status: Abnormal   Collection Time: 09/12/23 10:25 AM  Result Value Ref Range   Salicylate Lvl <7.0 (L) 7.0 - 30.0 mg/dL    Comment: Performed at [facility], [address], [place], [place]  [address]  Acetaminophen level     Status: Abnormal   Collection Time: 09/12/23 10:25 AM  Result Value Ref Range   Acetaminophen (Tylenol), Serum <10 (L) 10 - 30 ug/mL    Comment: (NOTE) Therapeutic concentrations vary significantly. A range of 10-30 ug/mL  may be an effective concentration  for many patients. However, some  are best treated at concentrations outside of this range. Acetaminophen concentrations >150 ug/mL at 4 hours after ingestion  and >50 ug/mL at 12 hours after ingestion are often associated with  toxic reactions.  Performed at [facility], [address], [place], [place] [address]   cbc     Status: Abnormal   Collection Time: 09/12/23 10:25 AM  Result Value Ref Range   WBC 13.4 (H) 4.0 - 10.5 K/uL   RBC 6.55 (H) 4.22 - 5.81 MIL/uL   Hemoglobin 15.1 13.0 - 17.0 g/dL   HCT 60.4 54.0 - 98.1 %   MCV 72.1 (L) 80.0 - 100.0 fL   MCH 23.1 (L) 26.0 - 34.0 pg   MCHC 32.0 30.0 - 36.0 g/dL   RDW 19.1 (H) 47.8 - 29.5 %   Platelets 292 150 - 400 K/uL   nRBC 0.0 0.0 - 0.2 %    Comment: Performed at [facility], [address], [place], [place] [address]  Rapid urine drug screen (hospital performed)     Status: Abnormal   Collection Time: 09/12/23 10:25 AM  Result Value Ref Range   Opiates NONE DETECTED NONE DETECTED   Cocaine POSITIVE (A) NONE DETECTED   Benzodiazepines NONE DETECTED NONE DETECTED   Amphetamines NONE DETECTED NONE DETECTED   Tetrahydrocannabinol NONE DETECTED NONE DETECTED   Barbiturates NONE DETECTED NONE DETECTED    Comment: (NOTE) DRUG SCREEN FOR MEDICAL PURPOSES ONLY.  IF CONFIRMATION IS NEEDED FOR ANY PURPOSE, NOTIFY LAB WITHIN 5 DAYS.  LOWEST DETECTABLE LIMITS FOR URINE DRUG SCREEN Drug Class                     Cutoff (ng/mL) Amphetamine and metabolites    1000 Barbiturate and metabolites    200 Benzodiazepine                 200 Opiates and metabolites        300 Cocaine and metabolites         300 THC                            50 Performed at [facility], [address] [place], [place] [address]     Current Facility-Administered Medications  Medication Dose Route Frequency Provider Last Rate Last Admin   folic acid (FOLVITE) tablet 1 mg  1 mg Oral Daily Neela Zecca C, NP       LORazepam (ATIVAN) tablet 1-4 mg  1-4 mg Oral Q1H PRN Dahlia Byes C, NP       Or   LORazepam (ATIVAN) injection 1-4 mg  1-4 mg Intravenous Q1H PRN Lundon Verdejo C, NP       LORazepam (ATIVAN) tablet 0-4 mg  0-4 mg Oral Q6H Bekki Tavenner C, NP       Followed by   Melene Muller ON 09/14/2023] LORazepam (ATIVAN) tablet 0-4 mg  0-4 mg Oral Q12H Georganna Maxson C, NP       multivitamin with minerals tablet 1 tablet  1 tablet Oral Daily Welford Christmas C, NP       thiamine (VITAMIN B1) tablet 100 mg  100 mg Oral Daily Yaqueline Gutter C, NP       Or   thiamine (VITAMIN B1) injection 100 mg  100 mg Intravenous Daily Dahlia Byes C, NP       No current outpatient medications on  file.    Musculoskeletal: Strength & Muscle Tone: within normal limits Gait & Station: normal Patient leans: Front   Psychiatric Specialty Exam: Presentation  General Appearance:  Casual; Disheveled  Eye Contact: Good  Speech: Clear and Coherent; Normal Rate  Speech Volume: Normal  Handedness: Right   Mood and Affect  Mood: Anxious; Hopeless; Worthless  Affect: Solicitor Processes: Coherent; Goal Directed  Descriptions of Associations:Intact  Orientation:Full (Time, Place and Person)  Thought Content:Logical  History of Schizophrenia/Schizoaffective disorder:No data recorded Duration of Psychotic Symptoms:No data recorded Hallucinations:Hallucinations: None  Ideas of Reference:None  Suicidal Thoughts:Suicidal Thoughts: No  Homicidal Thoughts:Homicidal Thoughts: No   Sensorium  Memory: Immediate Good; Recent Good;  Remote Good  Judgment: Impaired  Insight: Good   Executive Functions  Concentration: Fair  Attention Span: Fair  Recall: Fair  Fund of Knowledge: Fair  Language: Fair   Psychomotor Activity  Psychomotor Activity: Psychomotor Activity: Normal   Assets  Assets: Communication Skills; Desire for Improvement; Financial Resources/Insurance; Housing; Intimacy; Physical Health    Sleep  Sleep: Sleep: Fair   Physical Exam: Physical Exam Vitals and nursing note reviewed.  HENT:     Nose: Nose normal.  Cardiovascular:     Rate and Rhythm: Tachycardia present.  Pulmonary:     Effort: Pulmonary effort is normal.  Musculoskeletal:        General: Normal range of motion.  Skin:    General: Skin is dry.  Neurological:     Mental Status: He is alert and oriented to person, place, and time.  Psychiatric:        Attention and Perception: Attention and perception normal.        Mood and Affect: Mood is anxious and depressed.        Speech: Speech normal.        Behavior: Behavior normal. Behavior is cooperative.        Thought Content: Thought content normal.        Judgment: Judgment is impulsive.    Review of Systems  Constitutional: Negative.   HENT: Negative.    Eyes: Negative.   Respiratory: Negative.    Cardiovascular: Negative.   Gastrointestinal: Negative.   Genitourinary: Negative.   Musculoskeletal: Negative.   Skin: Negative.   Neurological: Negative.   Endo/Heme/Allergies: Negative.   Psychiatric/Behavioral:  The patient is nervous/anxious and has insomnia.        Alcoholism   Blood pressure (!) 155/104, pulse (!) 112, temperature 98.5 F (36.9 C), temperature source Oral, resp. rate 16, height 5\' 6"  (1.676 m), weight 59 kg, SpO2 100%. Body mass index is 20.98 kg/m.  Medical Decision Making: Patient meets criteria for inpatient Psychiatry hospitalization for Alcohol detox treatment.  He is now started on Ciwa protocol using Ativan.   Patient plans to engage in outpatient Alcohol treatment program or long term Alcohol treatment.   Disposition:  Admit and seek bed placement.  Earney Navy, NP-PMHNP-BC 09/12/2023 2:27 PM

## 2023-09-12 NOTE — Progress Notes (Signed)
Pt was accepted to [facility]  09/12/2023 pending EKG,  Bed Assignment 313  Address: [address], [place], [place] [address]  BMU FAX Number [phone]  Pt meets inpatient criteria per Dahlia Byes NP   Attending Physician will be Dr. Shellee Milo  Report can be called to: [phone]  Pt can arrive pending EKG   Care Team notified:[facility], [place] LCSW, Dahlia Byes NP, Alfredia Client RN, Roseanne Reno RN   [place] Trypp Heckmann LCSW-A  09/12/2023 3:43 PM
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
## 2023-09-12 NOTE — Tx Team (Signed)
Initial Treatment Plan 09/12/2023 7:48 PM Warren Cherry XBJ:[id number]    PATIENT STRESSORS: Marital or family conflict   Substance abuse     PATIENT STRENGTHS: Capable of independent living  Communication skills  General fund of knowledge    PATIENT IDENTIFIED PROBLEMS: Substance Abuse  Relationship issue                   DISCHARGE CRITERIA:  Improved stabilization in mood, thinking, and/or behavior  PRELIMINARY DISCHARGE PLAN: Outpatient therapy  PATIENT/FAMILY INVOLVEMENT: This treatment plan has been presented to and reviewed with the patient, Warren Cherry, and/or family member, .  The patient and family have been given the opportunity to ask questions and make suggestions.  Shelia Media, RN 09/12/2023, 7:48 PM

## 2023-09-12 NOTE — Progress Notes (Signed)
TOC consulted for substance abuse resources. Resources attached to AVS. No further TOC needs.

## 2023-09-12 NOTE — ED Triage Notes (Signed)
Pt arrived via POV. C/o SI, HI, and ETOH problem. Last drink was 3 hrs ago.  Pt is calm in triage.
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
## 2023-09-12 NOTE — ED Provider Notes (Signed)
[place] [facility] DEPARTMENT AT [facility] Provider Note   CSN: [id number] Arrival date & time: 09/12/23  4098     History  Chief Complaint  Patient presents with   Suicidal   Homicidal   Alcohol Problem    Warren Cherry is a 32 y.o. male.   Alcohol Problem     32 year old male with medical history significant prior suicide attempts who presents to the emergency department with suicidal ideation and homicidal ideation.  The patient states that he was drinking alcohol last night.  He endorsed thoughts of killing himself and additionally was endorsing homicidal ideation.  He states that he thinks he had a panic attack last night in the setting of his alcohol use.  He is seeking help today after talking to his employer this morning.  He currently denies any SI, denies any HI, denies any AVH. He presents voluntarily for evaluation.  Home Medications Prior to Admission medications   Not on File      Allergies    Gramineae pollens    Review of Systems   Review of Systems  Psychiatric/Behavioral:  Positive for suicidal ideas. The patient is nervous/anxious.   All other systems reviewed and are negative.   Physical Exam Updated Vital Signs BP (!) 155/104 (BP Location: Left Arm)   Pulse (!) 112   Temp 98.5 F (36.9 C) (Oral)   Resp 16   Ht 5\' 6"  (1.676 m)   Wt 59 kg   SpO2 100%   BMI 20.98 kg/m  Physical Exam Vitals and nursing note reviewed.  Constitutional:      General: He is not in acute distress. HENT:     Head: Normocephalic and atraumatic.  Eyes:     Conjunctiva/sclera: Conjunctivae normal.     Pupils: Pupils are equal, round, and reactive to light.  Cardiovascular:     Rate and Rhythm: Normal rate and regular rhythm.  Pulmonary:     Effort: Pulmonary effort is normal. No respiratory distress.  Abdominal:     General: There is no distension.     Tenderness: There is no guarding.  Musculoskeletal:        General: No deformity or signs of  injury.     Cervical back: Neck supple.  Skin:    Findings: No lesion or rash.  Neurological:     General: No focal deficit present.     Mental Status: He is alert. Mental status is at baseline.  Psychiatric:        Attention and Perception: Attention and perception normal.        Mood and Affect: Mood is anxious and depressed.        Speech: Speech normal.        Behavior: Behavior is withdrawn. Behavior is cooperative.        Thought Content: Thought content does not include homicidal or suicidal ideation.     Comments: Appears clinically sober     ED Results / Procedures / Treatments   Labs (all labs ordered are listed, but only abnormal results are displayed) Labs Reviewed  COMPREHENSIVE METABOLIC PANEL - Abnormal; Notable for the following components:      Result Value   Sodium 132 (*)    CO2 20 (*)    Glucose, Bld 148 (*)    BUN 21 (*)    Total Protein 8.2 (*)    AST 78 (*)    Alkaline Phosphatase 32 (*)    Total Bilirubin 1.4 (*)  All other components within normal limits  SALICYLATE LEVEL - Abnormal; Notable for the following components:   Salicylate Lvl <7.0 (*)    All other components within normal limits  ACETAMINOPHEN LEVEL - Abnormal; Notable for the following components:   Acetaminophen (Tylenol), Serum <10 (*)    All other components within normal limits  CBC - Abnormal; Notable for the following components:   WBC 13.4 (*)    RBC 6.55 (*)    MCV 72.1 (*)    MCH 23.1 (*)    RDW 16.2 (*)    All other components within normal limits  RAPID URINE DRUG SCREEN, HOSP PERFORMED - Abnormal; Notable for the following components:   Cocaine POSITIVE (*)    All other components within normal limits    EKG None  Radiology No results found.  Procedures Procedures    Medications Ordered in ED Medications - No data to display  ED Course/ Medical Decision Making/ A&P Clinical Course as of 09/12/23 1114  Mon Sep 12, 2023  1110 COCAINE(!): POSITIVE [JL]     Clinical Course User Index [JL] Ernie Avena, MD                                 Medical Decision Making Amount and/or Complexity of Data Reviewed Labs: ordered. Decision-making details documented in ED Course.    32 year old male with medical history significant prior suicide attempts who presents to the emergency department with suicidal ideation and homicidal ideation.  The patient states that he was drinking alcohol last night.  He endorsed thoughts of killing himself and additionally was endorsing homicidal ideation.  He states that he thinks he had a panic attack last night in the setting of his alcohol use.  He is seeking help today after talking to his employer this morning.  He currently denies any SI, denies any HI, denies any AVH. He presents voluntarily for evaluation.   On arrival, the patient was afebrile, mildly tachycardic, hemodynamically stable.  Presenting with SI and HI in the setting of alcohol use.  Requesting evaluation by psychiatry for assistance.  Currently voluntarily presenting for evaluation and is not agitated, does currently recant SI and HI of her he has a history of recurrent episodes of SI and has a history of suicide attempt and screens high risk by the [place] suicide risk screening tool.  Appears to be clinically sober at this time.  [facility] laboratory evaluation revealed UDS positive for cocaine, Tylenol and salicylate levels negative, CBC with a nonspecific leukocytosis to 13.4, CMP with mild hyponatremia to 132, mild non-anion gap acidosis with a bicarbonate of 20, anion gap of 14, blood glucose 148, creatinine at baseline, mildly elevated AST and alcoholic liver injury pattern, AST 78, ALT 33, mildly elevated T. bili to 1.4.  Patient's physical exam is overall reassuring, presenting with high risk screening by the [place] suicide risk screening tool, presenting voluntarily for psychiatric evaluation.  Overall at this time, the patient is medically  cleared for psychiatric consultation, TTS consult placed.   Final Clinical Impression(s) / ED Diagnoses Final diagnoses:  Homicidal ideation  Suicidal ideation  Alcohol abuse    Rx / DC Orders ED Discharge Orders     None         Ernie Avena, MD 09/12/23 1114

## 2023-09-13 ENCOUNTER — Encounter: Payer: Self-pay | Admitting: Nurse Practitioner

## 2023-09-13 DIAGNOSIS — F1020 Alcohol dependence, uncomplicated: Secondary | ICD-10-CM | POA: Diagnosis not present

## 2023-09-13 MED ORDER — LOPERAMIDE HCL 2 MG PO CAPS: 2.0000 mg | ORAL_CAPSULE | ORAL | Status: AC | PRN

## 2023-09-13 MED ORDER — ENSURE ENLIVE PO LIQD
1.0000 | Freq: Two times a day (BID) | ORAL | Status: DC
  Administered 2023-09-15 – 2023-09-16 (×3): 237 mL via ORAL

## 2023-09-13 MED ORDER — IBUPROFEN 100 MG/5ML PO SUSP
400.0000 mg | Freq: Three times a day (TID) | ORAL | Status: DC | PRN
  Filled 2023-09-13 (×2): qty 20

## 2023-09-13 MED ORDER — ONDANSETRON 4 MG PO TBDP: 4.0000 mg | ORAL_TABLET | Freq: Four times a day (QID) | ORAL | Status: AC | PRN

## 2023-09-13 MED ORDER — CHLORDIAZEPOXIDE HCL 25 MG PO CAPS
25.0000 mg | ORAL_CAPSULE | Freq: Three times a day (TID) | ORAL | Status: DC
  Administered 2023-09-13 – 2023-09-14 (×3): 25 mg via ORAL
  Filled 2023-09-13 (×8): qty 1

## 2023-09-13 MED ORDER — NICOTINE 14 MG/24HR TD PT24
14.0000 mg | MEDICATED_PATCH | Freq: Every day | TRANSDERMAL | Status: DC
  Administered 2023-09-13 (×2): 14 mg via TRANSDERMAL
  Filled 2023-09-13 (×2): qty 1

## 2023-09-13 MED ORDER — ADULT MULTIVITAMIN W/MINERALS CH
1.0000 | ORAL_TABLET | Freq: Every day | ORAL | Status: DC
  Administered 2023-09-14 – 2023-09-16 (×3): 1 via ORAL
  Filled 2023-09-13 (×4): qty 1

## 2023-09-13 MED ORDER — HYDROXYZINE HCL 25 MG PO TABS
25.0000 mg | ORAL_TABLET | Freq: Four times a day (QID) | ORAL | Status: AC | PRN
Start: 2023-09-13 — End: 2023-09-16
  Administered 2023-09-13 – 2023-09-14 (×2): 25 mg via ORAL
  Filled 2023-09-13 (×2): qty 1

## 2023-09-13 MED ORDER — CHLORDIAZEPOXIDE HCL 25 MG PO CAPS
25.0000 mg | ORAL_CAPSULE | Freq: Four times a day (QID) | ORAL | Status: AC | PRN
Start: 2023-09-13 — End: 2023-09-16

## 2023-09-13 MED ORDER — THIAMINE MONONITRATE 100 MG PO TABS
100.0000 mg | ORAL_TABLET | Freq: Every day | ORAL | Status: DC
  Administered 2023-09-14 – 2023-09-16 (×3): 100 mg via ORAL
  Filled 2023-09-13 (×3): qty 1

## 2023-09-13 MED ORDER — TRAZODONE HCL 50 MG PO TABS: 50.0000 mg | ORAL_TABLET | Freq: Every evening | ORAL | Status: DC | PRN

## 2023-09-13 MED ORDER — THIAMINE HCL 100 MG/ML IJ SOLN: 100.0000 mg | Freq: Once | INTRAMUSCULAR | Status: DC

## 2023-09-13 MED ORDER — TRAZODONE HCL 50 MG PO TABS
50.0000 mg | ORAL_TABLET | Freq: Every day | ORAL | Status: DC
  Administered 2023-09-13 – 2023-09-14 (×2): 50 mg via ORAL
  Filled 2023-09-13 (×2): qty 1

## 2023-09-13 MED ORDER — GABAPENTIN 100 MG PO CAPS
100.0000 mg | ORAL_CAPSULE | Freq: Two times a day (BID) | ORAL | Status: DC
  Administered 2023-09-13 – 2023-09-14 (×3): 100 mg via ORAL
  Filled 2023-09-13 (×6): qty 1

## 2023-09-13 NOTE — Group Note (Signed)
Date:  09/13/2023 Time:  9:09 PM  Group Topic/Focus:  Wrap-Up Group:   The focus of this group is to help patients review their daily goal of treatment and discuss progress on daily workbooks.    Participation Level:  Active  Participation Quality:  Appropriate and Attentive  Affect:  Appropriate  Cognitive:  Alert and Appropriate  Insight: Appropriate  Engagement in Group:  Developing/Improving  Modes of Intervention:  Activity, Socialization, and Support  Additional Comments:     Emaline Karnes 09/13/2023, 9:09 PM

## 2023-09-13 NOTE — Plan of Care (Signed)
  Problem: Education: Goal: Verbalization of understanding the information provided will improve Outcome: Progressing   Problem: Health Behavior/Discharge Planning: Goal: Compliance with treatment plan for underlying cause of condition will improve Outcome: Progressing   Problem: Safety: Goal: Ability to remain free from injury will improve Outcome: Progressing

## 2023-09-13 NOTE — Group Note (Signed)
[facility] LCSW Group Therapy Note   Group Date: 09/13/2023 Start Time: 1315 End Time: 1415  Type of Therapy/Topic:  Group Therapy:  Feelings about Diagnosis  Participation Level:  Did Not Attend    Description of Group:    This group will allow patients to explore their thoughts and feelings about diagnoses they have received. Patients will be guided to explore their level of understanding and acceptance of these diagnoses. Facilitator will encourage patients to process their thoughts and feelings about the reactions of others to their diagnosis, and will guide patients in identifying ways to discuss their diagnosis with significant others in their lives. This group will be process-oriented, with patients participating in exploration of their own experiences as well as giving and receiving support and challenge from other group members.   Therapeutic Goals: 1. Patient will demonstrate understanding of diagnosis as evidence by identifying two or more symptoms of the disorder:  2. Patient will be able to express two feelings regarding the diagnosis 3. Patient will demonstrate ability to communicate their needs through discussion and/or role plays  Summary of Patient Progress: X   Therapeutic Modalities:   Cognitive Behavioral Therapy Brief Therapy Feelings Identification    Glenis Smoker, LCSW

## 2023-09-13 NOTE — Group Note (Signed)
Date:  09/13/2023 Time:  10:28 AM  Group Topic/Focus:  Building Self Esteem:   The Focus of this group is helping patients become aware of the effects of self-esteem on their lives, the things they and others do that enhance or undermine their self-esteem, seeing the relationship between their level of self-esteem and the choices they make and learning ways to enhance self-esteem. Emotional Education:   The focus of this group is to discuss what feelings/emotions are, and how they are experienced. Managing Feelings:   The focus of this group is to identify what feelings patients have difficulty handling and develop a plan to handle them in a healthier way upon discharge.    Participation Level:  Active  Participation Quality:  Appropriate  Affect:  Appropriate  Cognitive:  Appropriate  Insight: Appropriate  Engagement in Group:  Engaged  Modes of Intervention:  Activity  Additional Comments:    Omeka Holben 09/13/2023, 10:28 AM

## 2023-09-13 NOTE — H&P (Signed)
Psychiatric Admission Assessment Adult  Patient Identification: Warren Cherry MRN:  [id number] Date of Evaluation:  09/13/2023 Chief Complaint:  Alcohol use disorder, severe, dependence (HCC) [F10.20] Principal Diagnosis: Alcohol use disorder, severe, dependence (HCC) Diagnosis:  Principal Problem:   Alcohol use disorder, severe, dependence (HCC)  History of Present Illness: 32 year old Asian Islander male who presents voluntarily for evaluation following an episode of suicidal and homicidal ideation while intoxicated. He reports drinking heavily with friends last night, which led to intoxication, suicidal thoughts, and threats toward his girlfriend. The patient contacted his employer this morning in distress, recognizing the need for help with his alcoholism. He describes himself as a "working alcoholic," consuming 12-18 bottles of beer daily after work, often drinking from 6 pm until he passes out around 2 am. He does not recall threatening his girlfriend or experiencing suicidal thoughts but acknowledges that his alcohol use has been steadily worsening.The patient began drinking at age 70-15, with significant increases in adulthood. He reports that his work takes him out of state, leading to feelings of isolation as he stays in hotel rooms. Although he denies depression, he cites work stress and loneliness as triggers for drinking. He has never attempted detoxification or sobriety before and admits to occasionally driving while intoxicated. He currently denies suicidal ideation (SI), homicidal ideation (HI), or auditory/visual hallucinations (AVH), and he remains motivated for treatment. Additionally, he has mild withdrawal symptoms, including head and hand tremors.Lab results show an elevated AST of 78 and a slightly elevated bilirubin of 1.4, suggesting potential liver impact from chronic alcohol use. The patient has a daughter and expressed a strong desire to improve his health for his family, stating  he is "tired of being sick." He is currently on a CIWA protocol for alcohol withdrawal with Librium.Cocaine is noted in patient UDS Associated Signs/Symptoms: Depression Symptoms:  depressed mood, anhedonia, insomnia, feelings of worthlessness/guilt, difficulty concentrating, (Hypo) Manic Symptoms:  Impulsivity, Labiality of Mood, Anxiety Symptoms:  Excessive Worry, Psychotic Symptoms:   none noted PTSD Symptoms: Negative Total Time spent with patient: 2 hours  Past Psychiatric History: none prior to this admission  Is the patient at risk to self? No.  Has the patient been a risk to self in the past 6 months? No.  Has the patient been a risk to self within the distant past? No.  Is the patient a risk to others? No.  Has the patient been a risk to others in the past 6 months? No.  Has the patient been a risk to others within the distant past? No.   [place] Scale:  Flowsheet Row Admission (Current) from 09/12/2023 in [facility] INPATIENT BEHAVIORAL MEDICINE Most recent reading at 09/12/2023  7:41 PM ED from 09/12/2023 in [facility] Emergency Department at [facility] Most recent reading at 09/12/2023  9:26 AM ED from 09/12/2022 in [facility] at [facility]  Most recent reading at 09/12/2022 12:04 PM  C-SSRS RISK CATEGORY No Risk High Risk No Risk        Prior Inpatient Therapy: No. If yes, describe none  Prior Outpatient Therapy: No. If yes, describe none   Alcohol Screening: 1. How often do you have a drink containing alcohol?: 2 to 3 times a week 2. How many drinks containing alcohol do you have on a typical day when you are drinking?: 3 or 4 3. How often do you have six or more drinks on one occasion?: Monthly AUDIT-C Score: 6 4. How often during the last year have you  found that you were not able to stop drinking once you had started?: Never 5. How often during the last year have you failed to do what was normally expected from you because of drinking?:  Never 6. How often during the last year have you needed a first drink in the morning to get yourself going after a heavy drinking session?: Never 7. How often during the last year have you had a feeling of guilt of remorse after drinking?: Never 8. How often during the last year have you been unable to remember what happened the night before because you had been drinking?: Never 9. Have you or someone else been injured as a result of your drinking?: No 10. Has a relative or friend or a doctor or another health worker been concerned about your drinking or suggested you cut down?: No Alcohol Use Disorder Identification Test Final Score (AUDIT): 6 Alcohol Brief Interventions/Follow-up: Alcohol education/Brief advice Substance Abuse History in the last 12 months:  Yes.   Consequences of Substance Abuse: Medical Consequences:  head and hand tremors Family Consequences:  family is concerned about excessive drinking DT's: lower seizure threshold Previous Psychotropic Medications: No  Psychological Evaluations: No  Past Medical History:  Past Medical History:  Diagnosis Date   ETOH abuse     Past Surgical History:  Procedure Laterality Date   EAR CANALOPLASTY     Family History: History reviewed. No pertinent family history. Family Psychiatric  History: none noted Tobacco Screening:  Social History   Tobacco Use  Smoking Status Former   Current packs/day: 0.00   Types: Cigarettes   Quit date: 2021   Years since quitting: 3.8  Smokeless Tobacco Never    BH Tobacco Counseling     Are you interested in Tobacco Cessation Medications?  Yes, implement Nicotene Replacement Protocol Counseled patient on smoking cessation:  Yes Reason Tobacco Screening Not Completed: No value filed.       Social History:  Social History   Substance and Sexual Activity  Alcohol Use Yes     Social History   Substance and Sexual Activity  Drug Use No    Additional Social History:                            Allergies:   Allergies  Allergen Reactions   Gramineae Pollens Other (See Comments)    Unknown    Lab Results:  Results for orders placed or performed during the hospital encounter of 09/12/23 (from the past 48 hour(s))  Comprehensive metabolic panel     Status: Abnormal   Collection Time: 09/12/23 10:25 AM  Result Value Ref Range   Sodium 132 (L) 135 - 145 mmol/L   Potassium 4.6 3.5 - 5.1 mmol/L   Chloride 98 98 - 111 mmol/L   CO2 20 (L) 22 - 32 mmol/L   Glucose, Bld 148 (H) 70 - 99 mg/dL    Comment: Glucose reference range applies only to samples taken after fasting for at least 8 hours.   BUN 21 (H) 6 - 20 mg/dL   Creatinine, Ser 9.60 0.61 - 1.24 mg/dL   Calcium 9.2 8.9 - 45.4 mg/dL   Total Protein 8.2 (H) 6.5 - 8.1 g/dL   Albumin 4.4 3.5 - 5.0 g/dL   AST 78 (H) 15 - 41 U/L   ALT 33 0 - 44 U/L   Alkaline Phosphatase 32 (L) 38 - 126 U/L   Total Bilirubin 1.4 (H)  0.3 - 1.2 mg/dL   GFR, Estimated >44 >01 mL/min    Comment: (NOTE) Calculated using the CKD-EPI Creatinine Equation (2021)    Anion gap 14 5 - 15    Comment: Performed at [facility], [address] [place], [place] [address]  Salicylate level     Status: Abnormal   Collection Time: 09/12/23 10:25 AM  Result Value Ref Range   Salicylate Lvl <7.0 (L) 7.0 - 30.0 mg/dL    Comment: Performed at [facility], [address] [place], [place] [address]  Acetaminophen level     Status: Abnormal   Collection Time: 09/12/23 10:25 AM  Result Value Ref Range   Acetaminophen (Tylenol), Serum <10 (L) 10 - 30 ug/mL    Comment: (NOTE) Therapeutic concentrations vary significantly. A range of 10-30 ug/mL  may be an effective concentration for many patients. However, some  are best treated at concentrations outside of this range. Acetaminophen concentrations >150 ug/mL at 4 hours after ingestion  and >50 ug/mL at 12 hours after ingestion are often associated with   toxic reactions.  Performed at [facility], [address] [place], [place] [address]   cbc     Status: Abnormal   Collection Time: 09/12/23 10:25 AM  Result Value Ref Range   WBC 13.4 (H) 4.0 - 10.5 K/uL   RBC 6.55 (H) 4.22 - 5.81 MIL/uL   Hemoglobin 15.1 13.0 - 17.0 g/dL   HCT 25.9 56.3 - 87.5 %   MCV 72.1 (L) 80.0 - 100.0 fL   MCH 23.1 (L) 26.0 - 34.0 pg   MCHC 32.0 30.0 - 36.0 g/dL   RDW 64.3 (H) 32.9 - 51.8 %   Platelets 292 150 - 400 K/uL   nRBC 0.0 0.0 - 0.2 %    Comment: Performed at [facility], [address] [place], [place] [address]  Rapid urine drug screen (hospital performed)     Status: Abnormal   Collection Time: 09/12/23 10:25 AM  Result Value Ref Range   Opiates NONE DETECTED NONE DETECTED   Cocaine POSITIVE (A) NONE DETECTED   Benzodiazepines NONE DETECTED NONE DETECTED   Amphetamines NONE DETECTED NONE DETECTED   Tetrahydrocannabinol NONE DETECTED NONE DETECTED   Barbiturates NONE DETECTED NONE DETECTED    Comment: (NOTE) DRUG SCREEN FOR MEDICAL PURPOSES ONLY.  IF CONFIRMATION IS NEEDED FOR ANY PURPOSE, NOTIFY LAB WITHIN 5 DAYS.  LOWEST DETECTABLE LIMITS FOR URINE DRUG SCREEN Drug Class                     Cutoff (ng/mL) Amphetamine and metabolites    1000 Barbiturate and metabolites    200 Benzodiazepine                 200 Opiates and metabolites        300 Cocaine and metabolites        300 THC                            50 Performed at [facility], [address], [place], [place] [address]   Ethanol     Status: None   Collection Time: 09/12/23  2:20 PM  Result Value Ref Range   Alcohol, Ethyl (B) <10 <10 mg/dL    Comment: (NOTE) Lowest detectable limit for serum alcohol is 10 mg/dL.  For medical purposes only. Performed at [facility], [address] [place], [place] [address]     Blood Alcohol level:  Lab Results  Component Value Date   ETH <10  09/12/2023   ETH <5 06/03/2017   Current Medications: Current Facility-Administered Medications  Medication Dose Route Frequency Provider Last Rate Last Admin   alum & mag hydroxide-simeth (MAALOX/MYLANTA) 200-200-20 MG/5ML suspension 30 mL  30 mL Oral Q4H PRN Dahlia Byes C, NP       chlordiazePOXIDE (LIBRIUM) capsule 25 mg  25 mg Oral TID Myriam Forehand, NP   25 mg at 09/13/23 1130   chlordiazePOXIDE (LIBRIUM) capsule 25 mg  25 mg Oral Q6H PRN Myriam Forehand, NP       feeding supplement (ENSURE ENLIVE / ENSURE PLUS) liquid 237 mL  1 Bottle Oral BID BM Myriam Forehand, NP       folic acid (FOLVITE) tablet 1 mg  1 mg Oral Daily Dahlia Byes C, NP   1 mg at 09/13/23 5621   gabapentin (NEURONTIN) capsule 100 mg  100 mg Oral BID Myriam Forehand, NP   100 mg at 09/13/23 1145   haloperidol (HALDOL) tablet 5 mg  5 mg Oral TID PRN Earney Navy, NP       Or   haloperidol lactate (HALDOL) injection 5 mg  5 mg Intramuscular TID PRN Earney Navy, NP       hydrOXYzine (ATARAX) tablet 25 mg  25 mg Oral Q6H PRN Myriam Forehand, NP       ibuprofen (ADVIL) 100 MG/5ML suspension 400 mg  400 mg Oral Q8H PRN Myriam Forehand, NP       loperamide (IMODIUM) capsule 2-4 mg  2-4 mg Oral PRN Myriam Forehand, NP       magnesium hydroxide (MILK OF MAGNESIA) suspension 30 mL  30 mL Oral Daily PRN Dahlia Byes C, NP       multivitamin with minerals tablet 1 tablet  1 tablet Oral Daily Myriam Forehand, NP       nicotine (NICODERM CQ - dosed in mg/24 hours) patch 14 mg  14 mg Transdermal Daily Myriam Forehand, NP   14 mg at 09/13/23 1232   ondansetron (ZOFRAN-ODT) disintegrating tablet 4 mg  4 mg Oral Q6H PRN Myriam Forehand, NP       thiamine (VITAMIN B1) injection 100 mg  100 mg Intravenous Daily Dahlia Byes C, NP       traZODone (DESYREL) tablet 50 mg  50 mg Oral QHS PRN Myriam Forehand, NP       PTA Medications: No medications prior to admission.    Musculoskeletal: Strength & Muscle Tone: within  normal limits Gait & Station: normal Patient leans: N/A            Psychiatric Specialty Exam:  Presentation  General Appearance:  Appropriate for Environment  Eye Contact: Poor  Speech: Clear and Coherent; Normal Rate  Speech Volume: Normal  Handedness: Right   Mood and Affect  Mood: Depressed  Affect: Congruent; Appropriate   Thought Process  Thought Processes: Coherent  Duration of Psychotic Symptoms: depression for the past 30 days Past Diagnosis of Schizophrenia or Psychoactive disorder: none noted Descriptions of Associations:Intact  Orientation:Full (Time, Place and Person) (and situation)  Thought Content:WDL  Hallucinations:Hallucinations: None  Ideas of Reference:None  Suicidal Thoughts:Suicidal Thoughts: No SI Passive Intent and/or Plan: -- (none noted)  Homicidal Thoughts:Homicidal Thoughts: No   Sensorium  Memory: Immediate Fair; Remote Fair  Judgment:  Poor  Insight: Fair   Chartered certified accountant: Fair  Attention Span: Fair  Recall: Fair  Fund of Knowledge: Good  Language: Good   Psychomotor Activity  Psychomotor Activity: Psychomotor Activity: Normal   Assets  Assets: Communication Skills; Social Support; Desire for Improvement; Financial Resources/Insurance; Housing   Sleep  Sleep: Sleep: Fair Number of Hours of Sleep: 6    Physical Exam: Physical Exam Vitals and nursing note reviewed.  HENT:     Head: Normocephalic and atraumatic.     Nose: Nose normal.  Pulmonary:     Effort: Pulmonary effort is normal.  Musculoskeletal:        General: Normal range of motion.     Cervical back: Normal range of motion.  Neurological:     Mental Status: He is alert and oriented to person, place, and time.     Motor: Tremor present.     Coordination: Coordination is intact.     Comments: Head and hand tremors  Psychiatric:        Attention and Perception: Attention and perception  normal.        Mood and Affect: Mood is depressed. Affect is flat.        Speech: Speech normal.        Behavior: Behavior normal. Behavior is cooperative.        Thought Content: Thought content normal.        Cognition and Memory: Cognition and memory normal.        Judgment: Judgment normal.    Review of Systems  Psychiatric/Behavioral:  The patient is nervous/anxious.   All other systems reviewed and are negative.  Blood pressure 117/78, pulse 66, temperature 99.6 F (37.6 C), resp. rate 20, height 5\' 6"  (1.676 m), weight 56.9 kg, SpO2 98%. Body mass index is 20.26 kg/m.  Treatment Plan Summary: Daily contact with patient to assess and evaluate symptoms and progress in treatment and Medication management Librium 25 mg q6 hours per CIWA protocol for withdrawal symptom management. Gabapentin 100 mg BID to aid with anxiety and alcohol withdrawal symptoms. Trazodone 25 mg nightly to assist with sleep disturbances. Educated the patient on the potential dangers of chronic alcohol use, including risks of seizures and hallucinations. Initiated contact for Alcoholics Anonymous (AA) group participation and obtaining a sponsor. Arrange follow-up for discharge planning, including outpatient treatment and support in maintaining sobriety.   Observation Level/Precautions:  Detox 1 to 1 15 minute checks Seizure  Laboratory:  Chemistry Profile  Psychotherapy:    Medications:  CIWA protocol with Libruim Gapapentin  Consultations:    Discharge Concerns:    Estimated LOS:  Other:     Physician Treatment Plan for Primary Diagnosis: Alcohol use disorder, severe, dependence (HCC) Long Term Goal(s): Improvement in symptoms so as ready for discharge  Short Term Goals: Ability to identify changes in lifestyle to reduce recurrence of condition will improve, Ability to verbalize feelings will improve, Ability to disclose and discuss suicidal ideas, Ability to demonstrate self-control will improve,  Ability to identify and develop effective coping behaviors will improve, Ability to maintain clinical measurements within normal limits will improve, Compliance with prescribed medications will improve, and Ability to identify triggers associated with substance abuse/mental health issues will improve  Physician Treatment Plan for Secondary Diagnosis: Principal Problem:   Alcohol use disorder, severe, dependence (HCC)  Long Term Goal(s): Improvement in symptoms so as ready for discharge  Short Term Goals: Ability to identify changes in lifestyle to reduce recurrence of condition  will improve, Ability to verbalize feelings will improve, Ability to disclose and discuss suicidal ideas, Ability to demonstrate self-control will improve, Ability to identify and develop effective coping behaviors will improve, Ability to maintain clinical measurements within normal limits will improve, Compliance with prescribed medications will improve, and Ability to identify triggers associated with substance abuse/mental health issues will improve  I certify that inpatient services furnished can reasonably be expected to improve the patient's condition.    Myriam Forehand, NP 10/29/20241:14 PM

## 2023-09-13 NOTE — Group Note (Signed)
Date:  09/13/2023 Time:  6:07 PM  Group Topic/Focus:  Outdoor recreation structured activity    Participation Level:  Active  Participation Quality:  Appropriate  Affect:  Appropriate  Cognitive:  Appropriate  Insight: Appropriate  Engagement in Group:  Developing/Improving  Modes of Intervention:  Activity  Additional Comments:    Warren Cherry 09/13/2023, 6:07 PM

## 2023-09-13 NOTE — Progress Notes (Signed)
Patient presents with flat affect and remains mostly isolative to room. Patient stated he just wanted to get some sleep today. Patient did come out of room for meals and spent a few minutes outside during recreation. Patient denies SI,HI, and A/V/H with no plan or intent. Patient does endorse anxiety and has slight tremors in hand bilaterally as well as a mild headache. Patient denies any other s/s of withdrawal. Patients latest CIWA score is 1 and appears less anxious since starting withdrawal protocol. Patient compliant with medications throughout day with the exception of this afternoon. Patient refused his librium stating "it made me sleep" "It made me dizzy." Patient was compliant with his gabapentin. Tremors visually improved. Patient denies any current s/s of distress at this time.

## 2023-09-13 NOTE — Plan of Care (Signed)
Pt new to the unit tonight, hasn't had time to progress   Problem: Education: Goal: Knowledge of [facility] Education information/materials will improve Outcome: Not Progressing Goal: Emotional status will improve Outcome: Not Progressing Goal: Mental status will improve Outcome: Not Progressing Goal: Verbalization of understanding the information provided will improve Outcome: Not Progressing   Problem: Activity: Goal: Interest or engagement in activities will improve Outcome: Not Progressing Goal: Sleeping patterns will improve Outcome: Not Progressing   Problem: Coping: Goal: Ability to verbalize frustrations and anger appropriately will improve Outcome: Not Progressing Goal: Ability to demonstrate self-control will improve Outcome: Not Progressing   Problem: Health Behavior/Discharge Planning: Goal: Identification of resources available to assist in meeting health care needs will improve Outcome: Not Progressing Goal: Compliance with treatment plan for underlying cause of condition will improve Outcome: Not Progressing   Problem: Physical Regulation: Goal: Ability to maintain clinical measurements within normal limits will improve Outcome: Not Progressing   Problem: Safety: Goal: Periods of time without injury will increase Outcome: Not Progressing   Problem: Education: Goal: Knowledge of [facility] Education information/materials will improve Outcome: Not Progressing Goal: Emotional status will improve Outcome: Not Progressing Goal: Mental status will improve Outcome: Not Progressing Goal: Verbalization of understanding the information provided will improve Outcome: Not Progressing   Problem: Activity: Goal: Interest or engagement in activities will improve Outcome: Not Progressing Goal: Sleeping patterns will improve Outcome: Not Progressing   Problem: Coping: Goal: Ability to verbalize frustrations and anger appropriately will  improve Outcome: Not Progressing Goal: Ability to demonstrate self-control will improve Outcome: Not Progressing   Problem: Health Behavior/Discharge Planning: Goal: Identification of resources available to assist in meeting health care needs will improve Outcome: Not Progressing Goal: Compliance with treatment plan for underlying cause of condition will improve Outcome: Not Progressing   Problem: Physical Regulation: Goal: Ability to maintain clinical measurements within normal limits will improve Outcome: Not Progressing   Problem: Safety: Goal: Periods of time without injury will increase Outcome: Not Progressing   Problem: Education: Goal: Knowledge of disease or condition will improve Outcome: Not Progressing Goal: Understanding of discharge needs will improve Outcome: Not Progressing   Problem: Health Behavior/Discharge Planning: Goal: Ability to identify changes in lifestyle to reduce recurrence of condition will improve Outcome: Not Progressing Goal: Identification of resources available to assist in meeting health care needs will improve Outcome: Not Progressing   Problem: Physical Regulation: Goal: Complications related to the disease process, condition or treatment will be avoided or minimized Outcome: Not Progressing   Problem: Safety: Goal: Ability to remain free from injury will improve Outcome: Not Progressing

## 2023-09-13 NOTE — Group Note (Signed)
Recreation Therapy Group Note   Group Topic:Self-Esteem  Group Date: 09/13/2023 Start Time: 1010 End Time: 1105 Facilitators: Rosina Lowenstein, LRT, CTRS Location:  Craft Room  Group Description: My strengths and Qualities. Patients and LRT discussed the importance of self-love/self-esteem and things that cause it to fluctuate, including our mental health or state. Pt completed a worksheet that helps them identify 24 different strengths and qualities about themselves. Pt encouraged to read aloud at least 3 off their sheet to the group. LRT and pts discussed how this can be applied to daily life post-discharge.  Pt's then played "Positive Affirmation Bingo" afterwards, with stress balls as prizes.     Goal Area(s) Addressed: Patient will identify positive qualities about themselves. Patient will learn new positive affirmations.  Patient will recite positive qualities and affirmations aloud to the group.  Patient will practice positive self-talk.  Patient will increase communication.  Affect/Mood: Appropriate   Participation Level: Active and Engaged   Participation Quality: Independent   Behavior: Calm and Cooperative   Speech/Thought Process: Coherent   Insight: Fair   Judgement: Good   Modes of Intervention: Activity and Worksheet   Patient Response to Interventions:  Attentive and Receptive   Education Outcome:  Acknowledges education   Clinical Observations/Individualized Feedback: Warren Cherry was active in their participation of session activities and group discussion. Pt identified "I have helped others by fixing something that's broken and giving them positive feedback. Something that makes me unique is the person that comes through. What I value the most is my family and my Lord and savior". Pt interacted well with LRT and peers duration of session.   Plan: Continue to engage patient in RT group sessions 2-3x/week.   Rosina Lowenstein, LRT, CTRS 09/13/2023 11:20 AM

## 2023-09-13 NOTE — [facility] Suicide Risk Assessment (Addendum)
[facility] Admission Suicide Risk Assessment   Nursing information obtained from:  Patient Demographic factors:  NA Current Mental Status:  NA Loss Factors:  NA Historical Factors:  NA Risk Reduction Factors:  NA  Total Time spent with patient: 2 hours Principal Problem: Alcohol use disorder, severe, dependence (HCC) Diagnosis:  Principal Problem:   Alcohol use disorder, severe, dependence (HCC)  Subjective Data:  32 year old Asian Islander male, presents voluntarily for evaluation after experiencing suicidal and homicidal ideations while intoxicated last night. History of Present Illness: The patient reports a long history of heavy alcohol use, starting in adolescence (ages [age]) and escalating to daily consumption as an adult. Currently, he drinks 12-18 bottles of beer daily after work, from approximately 6 pm until 2 am, often drinking until he passes out. 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 Last night, after becoming intoxicated, he had suicidal ideation (SI) and homicidal ideation (HI) directed at his girlfriend, though he has no memory of these events. He reports calling his employer this morning, expressing a need for help with his alcoholism. 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 He has a daughter and expresses a desire to get better for his family.Reports head and hand tremors; currently stable on CIWA protocol with Librium coverage.   Continued Clinical Symptoms:  Alcohol Use Disorder Identification Test Final Score (AUDIT): 6 The "Alcohol Use Disorders Identification Test", Guidelines for Use in Primary Care, Second Edition.  World Science writer [facility]). Score between 0-7:  no or low risk or alcohol related problems. Score between 8-15:  moderate risk of alcohol related problems. Score between 16-19:  high  risk of alcohol related problems. Score 20 or above:  warrants further diagnostic evaluation for alcohol dependence and treatment.   CLINICAL FACTORS:   Depression:   Comorbid alcohol abuse/dependence Hopelessness Alcohol/Substance Abuse/Dependencies   Musculoskeletal: Strength & Muscle Tone: within normal limits Gait & Station: normal Patient leans: N/A  Psychiatric Specialty Exam:  Presentation  General Appearance:  Appropriate for Environment  Eye Contact: Poor  Speech: Clear and Coherent; Normal Rate  Speech Volume: Normal  Handedness: Right   Mood and Affect  Mood: Depressed  Affect: Congruent; Appropriate   Thought Process  Thought Processes: Coherent  Descriptions of Associations:Intact  Orientation:Full (Time, Place and Person) (and situation)  Thought Content:WDL  History of Schizophrenia/Schizoaffective disorder: none noted Duration of Psychotic Symptoms: none noted Hallucinations:Hallucinations: None  Ideas of Reference:None  Suicidal Thoughts:Suicidal Thoughts: No SI Passive Intent and/or Plan: -- (none noted)  Homicidal Thoughts:Homicidal Thoughts: No   Sensorium  Memory: Immediate Fair; Remote Fair  Judgment: Poor  Insight: Fair   Chartered certified accountant: Fair  Attention Span: Fair  Recall: Fair  Fund of Knowledge: Good  Language: Good   Psychomotor Activity  Psychomotor Activity: Psychomotor Activity: Normal   Assets  Assets: Communication Skills; Social Support; Desire for Improvement; Financial Resources/Insurance; Housing   Sleep  Sleep: Sleep: Fair Number of Hours of Sleep: 6    Physical Exam: Physical Exam Vitals and nursing note reviewed.  Constitutional:      Appearance: Normal appearance.  HENT:     Head: Normocephalic and atraumatic.     Nose: Nose normal.  Pulmonary:     Effort: Pulmonary effort is normal.  Musculoskeletal:        General: Normal range of motion.  Cervical back: Normal range of motion.  Neurological:     General: No focal deficit present.     Mental Status: He is alert and oriented to person, place, and time.     Motor: Tremor present.     Gait: Gait is intact.     Comments: Head and hand tremors noted  Psychiatric:        Attention and Perception: He is inattentive.        Mood and Affect: Mood is anxious and depressed. Affect is flat.        Speech: Speech normal.        Behavior: Behavior is cooperative.        Thought Content: Thought content normal.        Cognition and Memory: Cognition and memory normal.        Judgment: Judgment normal.    Review of Systems  Psychiatric/Behavioral:  Positive for depression. The patient is nervous/anxious.    Blood pressure 117/78, pulse 66, temperature 99.6 F (37.6 C), resp. rate 20, height 5\' 6"  (1.676 m), weight 56.9 kg, SpO2 98%. Body mass index is 20.26 kg/m.   COGNITIVE FEATURES THAT CONTRIBUTE TO RISK:  None    SUICIDE RISK:   Mild:  Suicidal ideation of limited frequency, intensity, duration, and specificity.  There are no identifiable plans, no associated intent, mild dysphoria and related symptoms, good self-control (both objective and subjective assessment), few other risk factors, and identifiable protective factors, including available and accessible social support.  PLAN OF CARE:  Librium 25 mg q6 hours per CIWA protocol for withdrawal symptom management. Gabapentin 100 mg BID to aid with anxiety and alcohol withdrawal symptoms. Trazodone 25 mg nightly to assist with sleep disturbances. Educated the patient on the potential dangers of chronic alcohol use, including risks of seizures and hallucinations. Initiated contact for Alcoholics Anonymous (AA) group participation and obtaining a sponsor. Arrange follow-up for discharge planning, including outpatient treatment and support in maintaining sobriety.   I certify that inpatient services furnished can  reasonably be expected to improve the patient's condition.   Myriam Forehand, NP 09/13/2023, 1:00 PM

## 2023-09-14 DIAGNOSIS — F411 Generalized anxiety disorder: Secondary | ICD-10-CM | POA: Insufficient documentation

## 2023-09-14 DIAGNOSIS — R030 Elevated blood-pressure reading, without diagnosis of hypertension: Secondary | ICD-10-CM

## 2023-09-14 DIAGNOSIS — R040 Epistaxis: Secondary | ICD-10-CM | POA: Diagnosis present

## 2023-09-14 DIAGNOSIS — R7989 Other specified abnormal findings of blood chemistry: Secondary | ICD-10-CM

## 2023-09-14 DIAGNOSIS — F1910 Other psychoactive substance abuse, uncomplicated: Secondary | ICD-10-CM

## 2023-09-14 DIAGNOSIS — D72829 Elevated white blood cell count, unspecified: Secondary | ICD-10-CM

## 2023-09-14 DIAGNOSIS — F1410 Cocaine abuse, uncomplicated: Secondary | ICD-10-CM | POA: Insufficient documentation

## 2023-09-14 DIAGNOSIS — F1020 Alcohol dependence, uncomplicated: Secondary | ICD-10-CM | POA: Diagnosis not present

## 2023-09-14 LAB — CBC WITH DIFFERENTIAL/PLATELET
Abs Immature Granulocytes: 0.03 10*3/uL (ref 0.00–0.07)
Basophils Absolute: 0.1 10*3/uL (ref 0.0–0.1)
Basophils Relative: 1 %
Eosinophils Absolute: 0.3 10*3/uL (ref 0.0–0.5)
Eosinophils Relative: 3 %
HCT: 39.4 % (ref 39.0–52.0)
Hemoglobin: 12.9 g/dL — ABNORMAL LOW (ref 13.0–17.0)
Immature Granulocytes: 0 %
Lymphocytes Relative: 20 %
Lymphs Abs: 1.8 10*3/uL (ref 0.7–4.0)
MCH: 23 pg — ABNORMAL LOW (ref 26.0–34.0)
MCHC: 32.7 g/dL (ref 30.0–36.0)
MCV: 70.2 fL — ABNORMAL LOW (ref 80.0–100.0)
Monocytes Absolute: 0.9 10*3/uL (ref 0.1–1.0)
Monocytes Relative: 10 %
Neutro Abs: 5.9 10*3/uL (ref 1.7–7.7)
Neutrophils Relative %: 66 %
Platelets: 193 10*3/uL (ref 150–400)
RBC: 5.61 MIL/uL (ref 4.22–5.81)
RDW: 14 % (ref 11.5–15.5)
WBC: 9 10*3/uL (ref 4.0–10.5)
nRBC: 0 % (ref 0.0–0.2)

## 2023-09-14 LAB — TYPE AND SCREEN
ABO/RH(D): B POS
Antibody Screen: NEGATIVE

## 2023-09-14 LAB — CBC
HCT: 39.6 % (ref 39.0–52.0)
Hemoglobin: 12.6 g/dL — ABNORMAL LOW (ref 13.0–17.0)
MCH: 22.5 pg — ABNORMAL LOW (ref 26.0–34.0)
MCHC: 31.8 g/dL (ref 30.0–36.0)
MCV: 70.6 fL — ABNORMAL LOW (ref 80.0–100.0)
Platelets: 193 10*3/uL (ref 150–400)
RBC: 5.61 MIL/uL (ref 4.22–5.81)
RDW: 13.9 % (ref 11.5–15.5)
WBC: 8.9 10*3/uL (ref 4.0–10.5)
nRBC: 0 % (ref 0.0–0.2)

## 2023-09-14 LAB — PROTIME-INR
INR: 0.9 (ref 0.8–1.2)
Prothrombin Time: 12.8 s (ref 11.4–15.2)

## 2023-09-14 LAB — APTT: aPTT: 31 s (ref 24–36)

## 2023-09-14 MED ORDER — SALINE SPRAY 0.65 % NA SOLN
1.0000 | NASAL | Status: DC | PRN
  Administered 2023-09-14 – 2023-09-15 (×3): 1 via NASAL
  Filled 2023-09-14: qty 44

## 2023-09-14 MED ORDER — NICOTINE POLACRILEX 2 MG MT GUM
2.0000 mg | CHEWING_GUM | OROMUCOSAL | Status: DC | PRN
  Administered 2023-09-14: 2 mg via ORAL
  Filled 2023-09-14: qty 1

## 2023-09-14 MED ORDER — HYDRALAZINE HCL 25 MG PO TABS
25.0000 mg | ORAL_TABLET | ORAL | Status: DC | PRN
  Administered 2023-09-15: 25 mg via ORAL
  Filled 2023-09-14 (×3): qty 1

## 2023-09-14 NOTE — Group Note (Signed)
Date:  09/14/2023 Time:  9:04 PM  Group Topic/Focus:  Stages of Change:   The focus of this group is to explain the stages of change and help patients identify changes they want to make upon discharge.    Participation Level:  Active  Participation Quality:  Appropriate and Attentive  Affect:  Appropriate  Cognitive:  Alert and Appropriate  Insight: Appropriate and Good  Engagement in Group:  Developing/Improving and Engaged  Modes of Intervention:  Clarification, Discussion, Education, Orientation, Rapport Building, Socialization, and Support  Additional Comments:     Shamyah Stantz 09/14/2023, 9:04 PM
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
## 2023-09-14 NOTE — Progress Notes (Addendum)
Patients nose bleeding again. Patient visibly irritable demanding to sing the 72hr form/discharge. Process explained to patient. Patient provided with his nasal spray. Patient then looked at his 1700 scheduled medications and walked away stating "I aint taking that shit." Patient did not sign 72hr form.  1905: Patient stated he was having a bowel movement and then his nose started bleeding. Patient still irritable and rude to staff stating "you are all not doing anything for me." "Theres blood all over my room." Room assessed and cleaned. Explained to patient regarding lab work and patient still irritable stating "Are you trying to check me for drugs?" Lab work process explained and patient agreeable at this time with labwork. Patient currently in visitation with his girlfriend.

## 2023-09-14 NOTE — Progress Notes (Signed)
Pt removed nicotine patch stating it gave him headache.  Pt requests to be changed to Nicorette Gum.  Orders updated in system.

## 2023-09-14 NOTE — Group Note (Signed)
Date:  09/14/2023 Time:  6:55 PM  Group Topic/Focus:  Wellness Toolbox:   The focus of this group is to discuss various aspects of wellness, balancing those aspects and exploring ways to increase the ability to experience wellness.  Patients will create a wellness toolbox for use upon discharge.    Participation Level:  Active  Participation Quality:  Appropriate  Affect:  Appropriate  Cognitive:  Appropriate  Insight: Appropriate  Engagement in Group:  Engaged  Modes of Intervention:  Activity  Additional Comments:    Wilford Corner 09/14/2023, 6:55 PM

## 2023-09-14 NOTE — BH IP Treatment Plan (Signed)
Interdisciplinary Treatment and Diagnostic Plan Update  09/14/2023 Time of Session: 9:51AM Warren Cherry MRN: [id number]  Principal Diagnosis: Alcohol use disorder, severe, dependence (HCC)  Secondary Diagnoses: Principal Problem:   Alcohol use disorder, severe, dependence (HCC)   Current Medications:  Current Facility-Administered Medications  Medication Dose Route Frequency Provider Last Rate Last Admin   alum & mag hydroxide-simeth (MAALOX/MYLANTA) 200-200-20 MG/5ML suspension 30 mL  30 mL Oral Q4H PRN Dahlia Byes C, NP       chlordiazePOXIDE (LIBRIUM) capsule 25 mg  25 mg Oral TID Myriam Forehand, NP   25 mg at 09/14/23 1247   chlordiazePOXIDE (LIBRIUM) capsule 25 mg  25 mg Oral Q6H PRN Myriam Forehand, NP       feeding supplement (ENSURE ENLIVE / ENSURE PLUS) liquid 237 mL  1 Bottle Oral BID BM Myriam Forehand, NP       folic acid (FOLVITE) tablet 1 mg  1 mg Oral Daily Dahlia Byes C, NP   1 mg at 09/14/23 0757   gabapentin (NEURONTIN) capsule 100 mg  100 mg Oral BID Myriam Forehand, NP   100 mg at 09/14/23 0757   haloperidol (HALDOL) tablet 5 mg  5 mg Oral TID PRN Earney Navy, NP       Or   haloperidol lactate (HALDOL) injection 5 mg  5 mg Intramuscular TID PRN Earney Navy, NP       hydrOXYzine (ATARAX) tablet 25 mg  25 mg Oral Q6H PRN Myriam Forehand, NP   25 mg at 09/13/23 2126   ibuprofen (ADVIL) 100 MG/5ML suspension 400 mg  400 mg Oral Q8H PRN Myriam Forehand, NP       loperamide (IMODIUM) capsule 2-4 mg  2-4 mg Oral PRN Myriam Forehand, NP       magnesium hydroxide (MILK OF MAGNESIA) suspension 30 mL  30 mL Oral Daily PRN Dahlia Byes C, NP       multivitamin with minerals tablet 1 tablet  1 tablet Oral Daily Myriam Forehand, NP   1 tablet at 09/14/23 4098   nicotine polacrilex (NICORETTE) gum 2 mg  2 mg Oral PRN Sarina Ill, DO   2 mg at 09/14/23 1247   ondansetron (ZOFRAN-ODT) disintegrating tablet 4 mg  4 mg Oral Q6H PRN Myriam Forehand, NP        thiamine (VITAMIN B1) tablet 100 mg  100 mg Oral Daily Myriam Forehand, NP   100 mg at 09/14/23 0757   traZODone (DESYREL) tablet 50 mg  50 mg Oral QHS Myriam Forehand, NP   50 mg at 09/13/23 2125   PTA Medications: No medications prior to admission.    Patient Stressors: Marital or family conflict   Substance abuse    Patient Strengths: Capable of independent living  Forensic psychologist fund of knowledge   Treatment Modalities: Medication Management, Group therapy, Case management,  1 to 1 session with clinician, Psychoeducation, Recreational therapy.   Physician Treatment Plan for Primary Diagnosis: Alcohol use disorder, severe, dependence (HCC) Long Term Goal(s): Improvement in symptoms so as ready for discharge   Short Term Goals: Ability to identify changes in lifestyle to reduce recurrence of condition will improve Ability to verbalize feelings will improve Ability to disclose and discuss suicidal ideas Ability to demonstrate self-control will improve Ability to identify and develop effective coping behaviors will improve Ability to maintain clinical measurements within normal limits will improve Compliance with prescribed medications will  improve Ability to identify triggers associated with substance abuse/mental health issues will improve  Medication Management: Evaluate patient's response, side effects, and tolerance of medication regimen.  Therapeutic Interventions: 1 to 1 sessions, Unit Group sessions and Medication administration.  Evaluation of Outcomes: Progressing  Physician Treatment Plan for Secondary Diagnosis: Principal Problem:   Alcohol use disorder, severe, dependence (HCC)  Long Term Goal(s): Improvement in symptoms so as ready for discharge   Short Term Goals: Ability to identify changes in lifestyle to reduce recurrence of condition will improve Ability to verbalize feelings will improve Ability to disclose and discuss suicidal ideas Ability  to demonstrate self-control will improve Ability to identify and develop effective coping behaviors will improve Ability to maintain clinical measurements within normal limits will improve Compliance with prescribed medications will improve Ability to identify triggers associated with substance abuse/mental health issues will improve     Medication Management: Evaluate patient's response, side effects, and tolerance of medication regimen.  Therapeutic Interventions: 1 to 1 sessions, Unit Group sessions and Medication administration.  Evaluation of Outcomes: Progressing   RN Treatment Plan for Primary Diagnosis: Alcohol use disorder, severe, dependence (HCC) Long Term Goal(s): Knowledge of disease and therapeutic regimen to maintain health will improve  Short Term Goals: Ability to demonstrate self-control, Ability to participate in decision making will improve, Ability to verbalize feelings will improve, Ability to disclose and discuss suicidal ideas, Ability to identify and develop effective coping behaviors will improve, and Compliance with prescribed medications will improve  Medication Management: RN will administer medications as ordered by provider, will assess and evaluate patient's response and provide education to patient for prescribed medication. RN will report any adverse and/or side effects to prescribing provider.  Therapeutic Interventions: 1 on 1 counseling sessions, Psychoeducation, Medication administration, Evaluate responses to treatment, Monitor vital signs and CBGs as ordered, Perform/monitor CIWA, COWS, AIMS and Fall Risk screenings as ordered, Perform wound care treatments as ordered.  Evaluation of Outcomes: Progressing   LCSW Treatment Plan for Primary Diagnosis: Alcohol use disorder, severe, dependence (HCC) Long Term Goal(s): Safe transition to appropriate next level of care at discharge, Engage patient in therapeutic group addressing interpersonal  concerns.  Short Term Goals: Engage patient in aftercare planning with referrals and resources, Increase social support, Increase ability to appropriately verbalize feelings, Increase emotional regulation, Facilitate acceptance of mental health diagnosis and concerns, Facilitate patient progression through stages of change regarding substance use diagnoses and concerns, Identify triggers associated with mental health/substance abuse issues, and Increase skills for wellness and recovery  Therapeutic Interventions: Assess for all discharge needs, 1 to 1 time with Social worker, Explore available resources and support systems, Assess for adequacy in community support network, Educate family and significant other(s) on suicide prevention, Complete Psychosocial Assessment, Interpersonal group therapy.  Evaluation of Outcomes: Progressing   Progress in Treatment: Attending groups: Yes. Participating in groups: Yes. Taking medication as prescribed: Yes. Toleration medication: Yes. Family/Significant other contact made: No, will contact:  once permission has been given Patient understands diagnosis: Yes. Discussing patient identified problems/goals with staff: Yes. Medical problems stabilized or resolved: Yes. Denies suicidal/homicidal ideation: Yes. Issues/concerns per patient self-inventory: No. Other: none  New problem(s) identified: No, Describe:  none  New Short Term/Long Term Goal(s): detox, elimination of symptoms of psychosis, medication management for mood stabilization; elimination of SI thoughts; development of comprehensive mental wellness/sobriety plan.   Patient Goals:  "stay away from alcohol"  Discharge Plan or Barriers: It has been recommended that patient seek residential for alcohol  use as well as attend AA/NA.  Patient has declined this recommendations stating that he can call his support system who can offer assistance. Patient continue to be ambivalent towards his mental  health and substance use.    Reason for Continuation of Hospitalization: Anxiety Depression Medication stabilization Suicidal ideation Withdrawal symptoms  Estimated Length of Stay:  1-7 days  Last 3 [place] Suicide Severity Risk Score: Flowsheet Row Admission (Current) from 09/12/2023 in [facility] INPATIENT BEHAVIORAL MEDICINE Most recent reading at 09/12/2023  7:41 PM ED from 09/12/2023 in [facility] Emergency Department at [facility] Most recent reading at 09/12/2023  9:26 AM ED from 09/12/2022 in [facility] at [facility]  Most recent reading at 09/12/2022 12:04 PM  C-SSRS RISK CATEGORY No Risk High Risk No Risk       Last PHQ 2/9 Scores:     No data to display          Scribe for Treatment Team: Harden Mo, LCSW 09/14/2023 1:18 PM

## 2023-09-14 NOTE — Progress Notes (Signed)
[facility] MD Progress Note  09/14/2023 6:01 PM Warren Cherry  MRN:  [id number] Subjective:  32 year old [place]er male,eports experiencing "random nosebleeds" and is noted to have elevated blood pressure readings. He expresses anxiety related to these symptoms. The patient states his last intake of alcohol was 48 hours ago, and he has a known history of alcohol (ETOH) abuse.with a history of alcohol abuse, presenting with elevated blood pressure and complaints of random nosebleeds. His last reported alcohol intake was 48 hours ago. Given his history and current symptoms, further evaluation for alcohol withdrawal, hypertension, and possible hematologic abnormalities is warranted.During treatment team meeting patient became upset about Detox stated " I know how to detox myself" Principal Problem: Alcohol use disorder, severe, dependence (HCC) Diagnosis: Principal Problem:   Alcohol use disorder, severe, dependence (HCC)  Total Time spent with patient: 2 hours  Past Psychiatric History: ETOH abuse Cocaine abuse  Past Medical History:  Past Medical History:  Diagnosis Date   ETOH abuse     Past Surgical History:  Procedure Laterality Date   EAR CANALOPLASTY     Family History: History reviewed. No pertinent family history. Family Psychiatric  History: none reported Social History:  Social History   Substance and Sexual Activity  Alcohol Use Yes     Social History   Substance and Sexual Activity  Drug Use No    Social History   Socioeconomic History   Marital status: Single    Spouse name: Not on file   Number of children: Not on file   Years of education: Not on file   Highest education level: Not on file  Occupational History   Not on file  Tobacco Use   Smoking status: Former    Current packs/day: 0.00    Types: Cigarettes    Quit date: 2021    Years since quitting: 3.8   Smokeless tobacco: Never  Vaping Use   Vaping status: Every Day  Substance and Sexual Activity    Alcohol use: Yes   Drug use: No   Sexual activity: Not on file  Other Topics Concern   Not on file  Social History Narrative   Not on file   Social Determinants of Health   Financial Resource Strain: Not on file  Food Insecurity: No Food Insecurity (09/12/2023)   Hunger Vital Sign    Worried About Running Out of Food in the Last Year: Never true    Ran Out of Food in the Last Year: Never true  Transportation Needs: No Transportation Needs (09/12/2023)   PRAPARE - Administrator, Civil Service (Medical): No    Lack of Transportation (Non-Medical): No  Physical Activity: Not on file  Stress: Not on file  Social Connections: Not on file   Additional Social History:                         Sleep: Negative  Appetite:  Negative  Current Medications: Current Facility-Administered Medications  Medication Dose Route Frequency Provider Last Rate Last Admin   alum & mag hydroxide-simeth (MAALOX/MYLANTA) 200-200-20 MG/5ML suspension 30 mL  30 mL Oral Q4H PRN Welford Roche, Josephine C, NP       chlordiazePOXIDE (LIBRIUM) capsule 25 mg  25 mg Oral TID Myriam Forehand, NP   25 mg at 09/14/23 1247   chlordiazePOXIDE (LIBRIUM) capsule 25 mg  25 mg Oral Q6H PRN Myriam Forehand, NP       feeding supplement (ENSURE ENLIVE /  ENSURE PLUS) liquid 237 mL  1 Bottle Oral BID BM Myriam Forehand, NP       folic acid (FOLVITE) tablet 1 mg  1 mg Oral Daily Dahlia Byes C, NP   1 mg at 09/14/23 0757   gabapentin (NEURONTIN) capsule 100 mg  100 mg Oral BID Myriam Forehand, NP   100 mg at 09/14/23 0757   haloperidol (HALDOL) tablet 5 mg  5 mg Oral TID PRN Earney Navy, NP       Or   haloperidol lactate (HALDOL) injection 5 mg  5 mg Intramuscular TID PRN Earney Navy, NP       hydrOXYzine (ATARAX) tablet 25 mg  25 mg Oral Q6H PRN Myriam Forehand, NP   25 mg at 09/13/23 2126   ibuprofen (ADVIL) 100 MG/5ML suspension 400 mg  400 mg Oral Q8H PRN Myriam Forehand, NP       loperamide  (IMODIUM) capsule 2-4 mg  2-4 mg Oral PRN Myriam Forehand, NP       magnesium hydroxide (MILK OF MAGNESIA) suspension 30 mL  30 mL Oral Daily PRN Dahlia Byes C, NP       multivitamin with minerals tablet 1 tablet  1 tablet Oral Daily Myriam Forehand, NP   1 tablet at 09/14/23 1610   nicotine polacrilex (NICORETTE) gum 2 mg  2 mg Oral PRN Sarina Ill, DO   2 mg at 09/14/23 1247   ondansetron (ZOFRAN-ODT) disintegrating tablet 4 mg  4 mg Oral Q6H PRN Myriam Forehand, NP       sodium chloride (OCEAN) 0.65 % nasal spray 1 spray  1 spray Each Nare PRN Myriam Forehand, NP   1 spray at 09/14/23 1742   thiamine (VITAMIN B1) tablet 100 mg  100 mg Oral Daily Myriam Forehand, NP   100 mg at 09/14/23 0757   traZODone (DESYREL) tablet 50 mg  50 mg Oral QHS Myriam Forehand, NP   50 mg at 09/13/23 2125    Lab Results: No results found for this or any previous visit (from the past 48 hour(s)).  Blood Alcohol level:  Lab Results  Component Value Date   ETH <10 09/12/2023   ETH <5 06/03/2017     AIMS:  , ,  ,  ,    CIWA:  CIWA-Ar Total: 5 COWS:     Musculoskeletal: Strength & Muscle Tone: within normal limits Gait & Station: normal Patient leans: N/A  Psychiatric Specialty Exam:  Presentation  General Appearance:  Appropriate for Environment  Eye Contact: Minimal  Speech: Clear and Coherent  Speech Volume: Decreased  Handedness: Left   Mood and Affect  Mood: Anxious; Irritable  Affect: Flat; Blunt   Thought Process  Thought Processes: Coherent  Descriptions of Associations:Intact  Orientation:Full (Time, Place and Person) (and situation)  Thought Content:Rumination  History of Schizophrenia/Schizoaffective disorder: none Duration of Psychotic Symptoms: none Hallucinations:Hallucinations: None  Ideas of Reference:None  Suicidal Thoughts:Suicidal Thoughts: No SI Passive Intent and/or Plan: -- (none noted)  Homicidal Thoughts:Homicidal Thoughts:  No   Sensorium  Memory: Immediate Fair; Remote Fair  Judgment: Poor  Insight: Poor   Executive Functions  Concentration: Fair  Attention Span: Poor  Recall: Good  Fund of Knowledge: Good  Language: Good   Psychomotor Activity  Psychomotor Activity: Psychomotor Activity: Normal   Assets  Assets: Communication Skills; Financial Resources/Insurance; Housing   Sleep  Sleep: Sleep: Fair Number of Hours of Sleep: 6  Physical Exam: Physical Exam Vitals and nursing note reviewed.  Constitutional:      Appearance: Normal appearance.  HENT:     Head: Normocephalic and atraumatic.     Nose:     Comments: bleeding Pulmonary:     Effort: Pulmonary effort is normal.  Musculoskeletal:        General: Normal range of motion.     Cervical back: Normal range of motion.  Neurological:     General: No focal deficit present.     Mental Status: He is alert and oriented to person, place, and time.  Psychiatric:        Attention and Perception: Attention normal.        Mood and Affect: Mood is anxious. Affect is flat and angry.        Speech: Speech normal.        Behavior: Behavior is uncooperative and agitated.        Thought Content: Thought content normal.        Cognition and Memory: Cognition and memory normal.        Judgment: Judgment is impulsive.    Review of Systems  HENT:  Positive for nosebleeds.   Psychiatric/Behavioral:  The patient is nervous/anxious.   All other systems reviewed and are negative.  Blood pressure (!) 153/98, pulse 98, temperature 97.9 F (36.6 C), resp. rate 18, height 5\' 6"  (1.676 m), weight 56.9 kg, SpO2 98%. Body mass index is 20.26 kg/m.   Treatment Plan Summary: Daily contact with patient to assess and evaluate symptoms and progress in treatment and Medication management Librium 25 mg q6 hours per CIWA protocol for withdrawal symptom management. Gabapentin 100 mg BID to aid with anxiety and alcohol withdrawal  symptoms. Trazodone 25 mg nightly to assist with sleep disturbances. Educated the patient on the potential dangers of chronic alcohol use, including risks of seizures and hallucinations. Initiated contact for Alcoholics Anonymous (AA) group participation and obtaining a sponsor. Arrange follow-up for discharge planning, including outpatient treatment and support in maintaining sobriety. Hospitalist Consult to evaluate elevated blood pressure, assess for underlying causes of nosebleeds, and determine if further intervention is needed for hypertensive management. CBC to investigate potential hematologic abnormalities, such as platelet issues or anemia, that may contribute to nosebleeds. Myriam Forehand, NP 09/14/2023, 6:01 PM

## 2023-09-14 NOTE — Group Note (Signed)
Date:  09/14/2023 Time:  2:11 PM  Group Topic/Focus:  Goals Group:   The focus of this group is to help patients establish daily goals to achieve during treatment and discuss how the patient can incorporate goal setting into their daily lives to aide in recovery.    Participation Level:  Active  Participation Quality:  Appropriate  Affect:  Appropriate  Cognitive:  Appropriate  Insight: Appropriate  Engagement in Group:  Engaged  Modes of Intervention:  Discussion, Education, and Support  Additional Comments:    Wilford Corner 09/14/2023, 2:11 PM

## 2023-09-14 NOTE — Group Note (Signed)
Recreation Therapy Group Note   Group Topic:Coping Skills  Group Date: 09/14/2023 Start Time: 1010 End Time: 1110 Facilitators: Rosina Lowenstein, LRT, CTRS Location:  Craft Room  Group Description: Mind Map.  Patient was provided a blank template of a diagram with 32 blank boxes in a tiered system, branching from the center (similar to a bubble chart). LRT directed patients to label the middle of the diagram "Coping Skills". LRT and patients then came up with 8 different coping skills as examples. Pt were directed to record their coping skills in the 2nd tier boxes closest to the center.  Patients would then share their coping skills with the group as LRT wrote them out. LRT gave a handout of 99 different coping skills at the end of group.   Goal Area(s) Addressed: Patients will be able to define "coping skills". Patient will identify new coping skills.  Patient will increase communication.   Affect/Mood: N/A   Participation Level: Did not attend    Clinical Observations/Individualized Feedback: Warren Cherry did not attend group.  Plan: Continue to engage patient in RT group sessions 2-3x/week.   Rosina Lowenstein, LRT, CTRS 09/14/2023 11:22 AM

## 2023-09-14 NOTE — Plan of Care (Signed)
  Problem: Education: Goal: Emotional status will improve Outcome: Progressing   Problem: Education: Goal: Mental status will improve Outcome: Progressing   

## 2023-09-14 NOTE — Progress Notes (Signed)
Patient denies SI,HI, and A/V/H with no plan or intent. Patient states he has good support and states he wants to discharge. Minimizing current situation. Patient irritable at times and still experiencing slight tremors. Patient educated on s/s of alcohol withdrawal. Patient appears hesitant when taking his meds at times but remains med compliant. Patient states his girlfriend is coming to see him today and that he plans to stop drinking so he can be there for his kids.Patient more visible in dayroom and interacting appropriately with peers. No s/s of current distress.

## 2023-09-14 NOTE — Progress Notes (Signed)
Nursing Shift Note:  1900-0700  Attended Evening Group: Yes Medication Compliant:  Yes Behavior: Anxious and Cooperative Sleep Quality: Good Significant Changes: None noted   09/14/23 0000  Psychosocial Assessment  Patient Complaints Depression  Eye Contact Fair  Facial Expression Sad  Affect Sad  Speech Soft  Interaction Minimal  Motor Activity Slow  Appearance/Hygiene Unremarkable  Behavior Characteristics Cooperative;Anxious  Mood Depressed;Anxious  Thought Process  Coherency WDL  Content WDL  Delusions None reported or observed  Perception WDL  Hallucination None reported or observed  Judgment Limited  Confusion None  Danger to Self  Current suicidal ideation? Denies  Danger to Others  Danger to Others None reported or observed

## 2023-09-14 NOTE — Progress Notes (Signed)
Patient notified RN nose is bleeding. Patient claims this is not new and has been happening all of his life. Patient states it normally happens when his nose gets too dry around this time of the year. Patient provided with tissue and monitored until nose stopped bleeding. NP contacted and made aware. NP stated she would get a medical consult. Patient in no current distress and denies any current pain.

## 2023-09-14 NOTE — Consult Note (Incomplete)
Medical Consultation   Warren Cherry  AOZ:[id number]  DOB: February 13, 1991  DOA: 09/12/2023  PCP: Patient, No Pcp Per   Outpatient Specialists:    Requesting physician: -[facility], Keith Rake  Reason for consultation: -Elevated blood pressure and a nosebleeding  History of Present Illness: Warren Cherry is an 32 y.o. male with PMH of polysubstance abuse (tobacco abuse, alcohol abuse, cocaine abuse), PTSD, who is admitted on 08/14/2023 due to alcohol use disorder.  We are asked to consult since patient has nosebleeding and elevated blood pressure.  Patient was initially admitted due to alcohol abuse. Patient was started on Librium 25 mg every 6 hours as needed.  Patient is stable in terms of alcohol abuse and withdrawal.  Patient reports that he had 4 episodes of nosebleeding since admission with dark red blood.  He feels dry, denies any injury to his nose.  Denies chest pain, cough, SOB.  No nausea, vomiting, diarrhea or abdominal pain.  Denies symptoms of UTI.  Patient denies hallucination, suicidal or homicidal ideations to me.  No dizziness or lightheadedness.  Date reviewed, lab, image and vitals: WBC 13.4, hemoglobin 15.1 --> 12.6, GFR> 60, Tylenol level less than 10, salicylate level less than 7, UDS positive for cocaine.  Abnormal liver function with ALP 32, AST 78, ALT 30, total bili 1.4.  Temperature normal, blood pressure 153/98, heart rate 115, 18, oxygen saturation 98% on room air.   EKG: I reviewed EKG independently.  Patient had EKG on 10/28 which showed sinus rhythm, QTc 425, RAD. Review of Systems:   General: no fevers, chills, no changes in body weight, no changes in appetite Skin: no rash HEENT: no blurry vision, hearing changes or sore throat.  Has nosebleeding Pulm: no dyspnea, coughing, wheezing CV: no chest pain, palpitations, shortness of breath Abd: no nausea/vomiting, abdominal pain, diarrhea/constipation GU: no dysuria, hematuria, polyuria Ext: no  arthralgias, myalgias Neuro: no weakness, numbness, or tingling    Past Medical History: Past Medical History:  Diagnosis Date   ETOH abuse     Past Surgical History: Past Surgical History:  Procedure Laterality Date   EAR CANALOPLASTY       Allergies:   Allergies  Allergen Reactions   Gramineae Pollens Other (See Comments)    Unknown      Social History:  reports that he quit smoking about 3 years ago. His smoking use included cigarettes. He has never used smokeless tobacco. He reports current alcohol use. He reports that he does not use drugs.  Family History: Family History  Problem Relation Age of Onset   Hypertension Father      Physical Exam: Vitals:   09/13/23 1628 09/14/23 0500 09/14/23 1243 09/14/23 1245  BP: (!) 144/113 (!) 148/98 (!) 153/98   Pulse: (!) 115 99 (!) 101 98  Resp: 18 17 18    Temp: 98 F (36.7 C) 97.9 F (36.6 C)    TempSrc:      SpO2: 98% 99% 98% 98%  Weight:      Height:         General: Not in acute distress HEENT: Has a little dried blood from right nare of his nose       Eyes: PERRL, EOMI, no scleral icterus.       ENT: No discharge from the ears and nose, no pharynx injection, no tonsillar enlargement.        Neck: No JVD, no bruit, no  mass felt. Heme: No neck lymph node enlargement. Cardiac: S1/S2, RRR, No murmurs, No gallops or rubs. Respiratory: No rales, wheezing, rhonchi or rubs. GI: Soft, nondistended, nontender, no rebound pain, no organomegaly, BS present. GU: No hematuria Ext: No pitting leg edema bilaterally. 1+DP/PT pulse bilaterally. Musculoskeletal: No joint deformities, No joint redness or warmth, no limitation of ROM in spin. Skin: No rashes.  Neuro: Alert, oriented X3, cranial nerves II-XII grossly intact, moves all extremities normally. Psych: Patient is not psychotic, no suicidal or hemocidal ideation.    Data reviewed:  I have personally reviewed following labs and imaging studies Labs:   CBC: Recent Labs  Lab 09/12/23 1025 09/14/23 1905 09/14/23 1908 09/15/23 0055  WBC 13.4* 9.0 8.9 9.0  NEUTROABS  --  5.9  --   --   HGB 15.1 12.9* 12.6* 11.8*  HCT 47.2 39.4 39.6 36.2*  MCV 72.1* 70.2* 70.6* 69.6*  PLT 292 193 193 182    Basic Metabolic Panel: Recent Labs  Lab 09/12/23 1025  NA 132*  K 4.6  CL 98  CO2 20*  GLUCOSE 148*  BUN 21*  CREATININE 0.99  CALCIUM 9.2   GFR Estimated Creatinine Clearance: 85.4 mL/min (by C-G formula based on SCr of 0.99 mg/dL). Liver Function Tests: Recent Labs  Lab 09/12/23 1025  AST 78*  ALT 33  ALKPHOS 32*  BILITOT 1.4*  PROT 8.2*  ALBUMIN 4.4   No results for input(s): "LIPASE", "AMYLASE" in the last 168 hours. No results for input(s): "AMMONIA" in the last 168 hours. Coagulation profile Recent Labs  Lab 09/14/23 1908  INR 0.9    Cardiac Enzymes: No results for input(s): "CKTOTAL", "CKMB", "CKMBINDEX", "TROPONINI" in the last 168 hours. BNP: Invalid input(s): "POCBNP" CBG: No results for input(s): "GLUCAP" in the last 168 hours. D-Dimer No results for input(s): "DDIMER" in the last 72 hours. Hgb A1c No results for input(s): "HGBA1C" in the last 72 hours. Lipid Profile No results for input(s): "CHOL", "HDL", "LDLCALC", "TRIG", "CHOLHDL", "LDLDIRECT" in the last 72 hours. Thyroid function studies No results for input(s): "TSH", "T4TOTAL", "T3FREE", "THYROIDAB" in the last 72 hours.  Invalid input(s): "FREET3" Anemia work up No results for input(s): "VITAMINB12", "FOLATE", "FERRITIN", "TIBC", "IRON", "RETICCTPCT" in the last 72 hours. Urinalysis No results found for: "COLORURINE", "APPEARANCEUR", "LABSPEC", "PHURINE", "GLUCOSEU", "HGBUR", "BILIRUBINUR", "KETONESUR", "PROTEINUR", "UROBILINOGEN", "NITRITE", "LEUKOCYTESUR"   Microbiology No results found for this or any previous visit (from the past 240 hour(s)).     Inpatient Medications:   Scheduled Meds:  chlordiazePOXIDE  25 mg Oral TID    feeding supplement  1 Bottle Oral BID BM   folic acid  1 mg Oral Daily   gabapentin  100 mg Oral BID   multivitamin with minerals  1 tablet Oral Daily   thiamine  100 mg Oral Daily   traZODone  50 mg Oral QHS   Continuous Infusions:   Radiological Exams on Admission: No results found.  Impression/Recommendations Principal Problem:   Alcohol use disorder, severe, dependence (HCC) Active Problems:   Bleeding nose   Abnormal LFTs   Elevated blood pressure reading   Leucocytosis   Polysubstance abuse (HCC)   Anxiety    Assessment and Plan:  Alcohol use disorder, severe, dependence (HCC): Patient is mildly tachycardic with heart rate 100-110, no acute alcohol withdrawal currently. -Patient is on as needed Librium 25 mg every 6 hours and scheduled Librium 25 mg 3 times daily -continu folic acid and vitamin B1  Bleeding nose: Hgb 15.1 -->  12.6.  Currently no active bleeding. -Check INR/PTT -Repeat CBC q8h -I recommended [facility], Keith Rake to consult ENT (Dr. Doristine Counter is on-call). She agreed to do so, I provided Dr. Mellody Life number to her.  Abnormal LFTs: Most likely due to alcohol abuse -Check hepatitis panel -Avoid using Tylenol  Elevated blood pressure reading: Blood pressure 153/98.  No history of hypertension.  May be due to cocaine abuse and alcohol withdrawal -Start prn hydralazine 25 mg every 2 hours for blood pressure> 150  Leucocytosis: WBC 13.4, no fever.  No source of infection identified.  Likely reactive -Follow-up by CBC  Polysubstance abuse (HCC) -Nicotine patch -Librium as needed for alcohol withdrawal -Did counseling about importance of quitting substance use  Anxiety: -Patient is on as needed haldol, hydroxyzine, trazodone         Thank you for this consultation.  Our [facility] hospitalist team will follow the patient with you.  Time Spent:  35 min     Lorretta Harp M.D. [facility] 09/15/2023, 2:28 AM

## 2023-09-14 NOTE — Plan of Care (Signed)
  Problem: Coping: Goal: Ability to demonstrate self-control will improve Outcome: Progressing   Problem: Health Behavior/Discharge Planning: Goal: Compliance with treatment plan for underlying cause of condition will improve Outcome: Progressing   Problem: Safety: Goal: Periods of time without injury will increase Outcome: Progressing

## 2023-09-15 ENCOUNTER — Encounter: Payer: Self-pay | Admitting: Nurse Practitioner

## 2023-09-15 DIAGNOSIS — F419 Anxiety disorder, unspecified: Secondary | ICD-10-CM | POA: Diagnosis present

## 2023-09-15 LAB — COMPREHENSIVE METABOLIC PANEL
ALT: 57 U/L — ABNORMAL HIGH (ref 0–44)
AST: 129 U/L — ABNORMAL HIGH (ref 15–41)
Albumin: 4.3 g/dL (ref 3.5–5.0)
Alkaline Phosphatase: 29 U/L — ABNORMAL LOW (ref 38–126)
Anion gap: 11 (ref 5–15)
BUN: 11 mg/dL (ref 6–20)
CO2: 29 mmol/L (ref 22–32)
Calcium: 9.9 mg/dL (ref 8.9–10.3)
Chloride: 96 mmol/L — ABNORMAL LOW (ref 98–111)
Creatinine, Ser: 0.69 mg/dL (ref 0.61–1.24)
GFR, Estimated: >60 mL/min (ref >60)
Glucose, Bld: 126 mg/dL — ABNORMAL HIGH (ref 70–99)
Potassium: 4.5 mmol/L (ref 3.5–5.1)
Sodium: 136 mmol/L (ref 135–145)
Total Bilirubin: 0.8 mg/dL (ref 0.3–1.2)
Total Protein: 7.7 g/dL (ref 6.5–8.1)

## 2023-09-15 LAB — CBC
HCT: 36.2 % — ABNORMAL LOW (ref 39.0–52.0)
Hemoglobin: 11.8 g/dL — ABNORMAL LOW (ref 13.0–17.0)
MCH: 22.7 pg — ABNORMAL LOW (ref 26.0–34.0)
MCHC: 32.6 g/dL (ref 30.0–36.0)
MCV: 69.6 fL — ABNORMAL LOW (ref 80.0–100.0)
Platelets: 182 10*3/uL (ref 150–400)
RBC: 5.2 MIL/uL (ref 4.22–5.81)
RDW: 13.6 % (ref 11.5–15.5)
WBC: 9 10*3/uL (ref 4.0–10.5)
nRBC: 0 % (ref 0.0–0.2)

## 2023-09-15 LAB — HEPATITIS PANEL, ACUTE
HCV Ab: NONREACTIVE
Hep A IgM: NONREACTIVE
Hep B C IgM: NONREACTIVE
Hepatitis B Surface Ag: NONREACTIVE

## 2023-09-15 MED ORDER — OXYMETAZOLINE HCL 0.05 % NA SOLN
1.0000 | Freq: Two times a day (BID) | NASAL | Status: DC
  Administered 2023-09-15: 1 via NASAL
  Filled 2023-09-15: qty 15

## 2023-09-15 NOTE — Group Note (Addendum)
Date:  09/15/2023 Time:  6:32 PM  Group Topic/Focus:  Goals Group:   The focus of this group is to help patients establish daily goals to achieve during treatment and discuss how the patient can incorporate goal setting into their daily lives to aide in recovery.   Participation Level:  Active  Participation Quality:  Appropriate and Attentive  Affect:  Appropriate  Cognitive:  Alert and Appropriate  Insight: Appropriate  Engagement in Group:  Engaged  Modes of Intervention:  Discussion and Education  Additional Comments:    Leesa Leifheit A Juanjose Mojica 09/15/2023, 6:32 PM

## 2023-09-15 NOTE — Progress Notes (Signed)
Patient pleasant and cooperative this shift. Voiced complaints about early shift and nose bleed. Reports staff did nothing to help. Hospitalist in to see patient tonight. New orders processed. Patient is anxious for discharge, states he is ready to go back to work. Has supportive girlfriend at home. Denies SI, HI, AVH. Understands he made a poor decision while under the influence.  Encouragement and support provided. Safety checks maintained. Medications given as prescribed. Pt receptive and remains safe on unit with q 15 min checks.

## 2023-09-15 NOTE — Progress Notes (Addendum)
  Progress Note   Patient: Warren Cherry WUX:[id number] DOB: [birth year] DOA: 09/12/2023     3 DOS: the patient was seen and examined on 09/15/2023   Brief hospital course: Sky Kirshenbaum is an 32 y.o. male with PMH of polysubstance abuse (tobacco abuse, alcohol abuse, cocaine abuse), PTSD, who is admitted on 08/14/2023 due to alcohol use disorder.  We are asked to consult since patient has nosebleeding and elevated blood pressure.    Principal Problem:   Alcohol use disorder, severe, dependence (HCC) Active Problems:   Bleeding nose   Abnormal LFTs   Elevated blood pressure reading   Leucocytosis   Polysubstance abuse (HCC)   Anxiety   Assessment and Plan:  Alcohol use disorder, severe, dependence [facility]):  Patient is treated by psychiatry.    Bleeding nose:  Patient had intermittent nosebleeding, currently no active bleeding.  Hemoglobin has been stable.  Discussed with Dr. Renato Shin, who will consult ENT. No additional recommendation from [place], will sign off.    Abnormal LFTs: Most likely due to alcohol abuse Negative hepatitis panel   Elevated blood pressure reading:  This appears to be due to nosebleeding, blood pressure much better today.  No need for scheduled blood pressure medicine.   Leucocytosis:  Resolved, no evidence of infection.  Polysubstance abuse (HCC) Followed by psychiatry.   Anxiety: Treated by psychiatry.        Subjective: Patient has no complaint today.  No additional nosebleeding today.  Physical Exam: Vitals:   09/14/23 0500 09/14/23 1243 09/14/23 1245 09/15/23 0644  BP: (!) 148/98 (!) 153/98  125/80  Pulse: 99 (!) 101 98 75  Resp: 17 18    Temp: 97.9 F (36.6 C)   98.6 F (37 C)  TempSrc:    Oral  SpO2: 99% 98% 98% 99%  Weight:      Height:       General exam: Appears calm and comfortable  Respiratory system: Clear to auscultation. Respiratory effort normal. Cardiovascular system: S1 & S2 heard, RRR. No JVD, murmurs, rubs, gallops or  clicks. No pedal edema. Gastrointestinal system: Abdomen is nondistended, soft and nontender. No organomegaly or masses felt. Normal bowel sounds heard. Central nervous system: Alert and oriented. No focal neurological deficits. Extremities: Symmetric 5 x 5 power. Skin: No rashes, lesions or ulcers Psychiatry: Judgement and insight appear normal. Mood & affect appropriate.    Data Reviewed:  Lab results reviewed.  Family Communication: None       Time spent: 35 minutes  Author: Marrion Coy, MD 09/15/2023 10:58 AM  For on call review [email or web address].

## 2023-09-15 NOTE — Plan of Care (Signed)
  Problem: Education: Goal: Mental status will improve Outcome: Progressing Goal: Verbalization of understanding the information provided will improve Outcome: Progressing   Problem: Activity: Goal: Interest or engagement in activities will improve Outcome: Progressing Goal: Sleeping patterns will improve Outcome: Progressing   Problem: Coping: Goal: Ability to verbalize frustrations and anger appropriately will improve Outcome: Progressing Goal: Ability to demonstrate self-control will improve Outcome: Progressing   Problem: Safety: Goal: Periods of time without injury will increase Outcome: Progressing

## 2023-09-15 NOTE — Group Note (Signed)
Date:  09/15/2023 Time:  7:13 PM  Group Topic/Focus:  Activity Group:  The focus of the group is to encourage activity with the patients and have them go outside to the courtyard for some fresh air and some exercise.    Participation Level:  Active  Participation Quality:  Appropriate  Affect:  Appropriate  Cognitive:  Appropriate  Insight: Appropriate  Engagement in Group:  Engaged  Modes of Intervention:  Activity  Additional Comments:    Macarthur Niziolek Ethelene Closser 09/15/2023, 7:13 PM

## 2023-09-15 NOTE — Group Note (Signed)
Recreation Therapy Group Note   Group Topic:Other  Group Date: 09/15/2023 Start Time: 1000 End Time: 1100 Facilitators: Rosina Lowenstein, LRT, CTRS Location:  Craft Room  Group Description: Sport and exercise psychologist. Pts were given a card with a Halloween term on it and asked to draw it out on the dry erase board, one at a time and without using words or sound, for the group to guess what they have drawn. Once the image was successfully drawn, the next person would draw their term on the board. 2 mummies were identified voluntarily from the group. The rest of the group was split into two groups. Pts were encouraged to wrap the 2 mummies from head to toe with a roll of toilet paper. The team to do so fastest and with the whole roll of toilet paper, wins.   Goal Area(s) Addressed:  Patient will increase communication skills. Patient will engage in a team-building activity.  Patient will build frustration tolerance skills.  Patient will gain knowledge of an emotional expression activity, like drawing.    Affect/Mood: Appropriate   Participation Level: Active and Engaged   Participation Quality: Independent   Behavior: Calm and Cooperative   Speech/Thought Process: Coherent   Insight: Good   Judgement: Good   Modes of Intervention: Activity   Patient Response to Interventions:  Attentive, Engaged, Interested , and Receptive   Education Outcome:  Acknowledges education   Clinical Observations/Individualized Feedback: Renold was active in their participation of session activities and group discussion. Pt interacted well with LRT and peers duration of session. Of note, pt was observed to be shaking/ having hand tremors while writing on the board.    Plan: Continue to engage patient in RT group sessions 2-3x/week.   Rosina Lowenstein, LRT, CTRS 09/15/2023 11:19 AM

## 2023-09-15 NOTE — Progress Notes (Signed)
[facility] MD Progress Note  09/15/2023 3:50 PM Warren Cherry  MRN:  [id number] Subjective: 32 -year-old [place]er male, "I am feeling better now. My nose has stopped bleeding, and I am ready to go home."denies suicidal ideation (SI), homicidal ideation (HI), self-injurious behavior (SIB), or hallucinations.Patient appears anxious but cooperative and oriented; no acute distress observed. Principal Problem: Alcohol use disorder, severe, dependence (HCC) Diagnosis: Principal Problem:   Alcohol use disorder, severe, dependence (HCC) Active Problems:   Bleeding nose   Abnormal LFTs   Leucocytosis   Elevated blood pressure reading   Polysubstance abuse (HCC)   Anxiety  Total Time spent with patient: 45 minutes  Past Psychiatric History: ETOH Abuse  Past Medical History:  Past Medical History:  Diagnosis Date   ETOH abuse     Past Surgical History:  Procedure Laterality Date   EAR CANALOPLASTY     Family History:  Family History  Problem Relation Age of Onset   Hypertension Father    Family Psychiatric  History: none noted Social History:  Social History   Substance and Sexual Activity  Alcohol Use Yes     Social History   Substance and Sexual Activity  Drug Use No    Social History   Socioeconomic History   Marital status: Single    Spouse name: Not on file   Number of children: Not on file   Years of education: Not on file   Highest education level: Not on file  Occupational History   Not on file  Tobacco Use   Smoking status: Former    Current packs/day: 0.00    Types: Cigarettes    Quit date: 2021    Years since quitting: 3.8   Smokeless tobacco: Never  Vaping Use   Vaping status: Every Day  Substance and Sexual Activity   Alcohol use: Yes   Drug use: No   Sexual activity: Not on file  Other Topics Concern   Not on file  Social History Narrative   Not on file   Social Determinants of Health   Financial Resource Strain: Not on file  Food  Insecurity: No Food Insecurity (09/12/2023)   Hunger Vital Sign    Worried About Running Out of Food in the Last Year: Never true    Ran Out of Food in the Last Year: Never true  Transportation Needs: No Transportation Needs (09/12/2023)   PRAPARE - Administrator, Civil Service (Medical): No    Lack of Transportation (Non-Medical): No  Physical Activity: Not on file  Stress: Not on file  Social Connections: Not on file   Additional Social History:                         Sleep: Fair  Appetite:  Good  Current Medications: Current Facility-Administered Medications  Medication Dose Route Frequency Provider Last Rate Last Admin   alum & mag hydroxide-simeth (MAALOX/MYLANTA) 200-200-20 MG/5ML suspension 30 mL  30 mL Oral Q4H PRN Welford Roche, Josephine C, NP       chlordiazePOXIDE (LIBRIUM) capsule 25 mg  25 mg Oral TID Myriam Forehand, NP   25 mg at 09/14/23 1247   chlordiazePOXIDE (LIBRIUM) capsule 25 mg  25 mg Oral Q6H PRN Myriam Forehand, NP       feeding supplement (ENSURE ENLIVE / ENSURE PLUS) liquid 237 mL  1 Bottle Oral BID BM Myriam Forehand, NP   237 mL at 09/15/23 1536   folic  acid (FOLVITE) tablet 1 mg  1 mg Oral Daily Dahlia Byes C, NP   1 mg at 09/15/23 1610   gabapentin (NEURONTIN) capsule 100 mg  100 mg Oral BID Myriam Forehand, NP   100 mg at 09/14/23 9604   haloperidol (HALDOL) tablet 5 mg  5 mg Oral TID PRN Dahlia Byes C, NP       Or   haloperidol lactate (HALDOL) injection 5 mg  5 mg Intramuscular TID PRN Dahlia Byes C, NP       hydrALAZINE (APRESOLINE) tablet 25 mg  25 mg Oral Q4H PRN Lorretta Harp, MD       hydrOXYzine (ATARAX) tablet 25 mg  25 mg Oral Q6H PRN Myriam Forehand, NP   25 mg at 09/14/23 2108   ibuprofen (ADVIL) 100 MG/5ML suspension 400 mg  400 mg Oral Q8H PRN Myriam Forehand, NP       loperamide (IMODIUM) capsule 2-4 mg  2-4 mg Oral PRN Myriam Forehand, NP       magnesium hydroxide (MILK OF MAGNESIA) suspension 30 mL  30 mL Oral Daily  PRN Earney Navy, NP       multivitamin with minerals tablet 1 tablet  1 tablet Oral Daily Myriam Forehand, NP   1 tablet at 09/15/23 5409   nicotine polacrilex (NICORETTE) gum 2 mg  2 mg Oral PRN Sarina Ill, DO   2 mg at 09/14/23 1247   ondansetron (ZOFRAN-ODT) disintegrating tablet 4 mg  4 mg Oral Q6H PRN Myriam Forehand, NP       oxymetazoline (AFRIN) 0.05 % nasal spray 1 spray  1 spray Each Nare BID Myriam Forehand, NP       sodium chloride (OCEAN) 0.65 % nasal spray 1 spray  1 spray Each Nare PRN Myriam Forehand, NP   1 spray at 09/15/23 8119   thiamine (VITAMIN B1) tablet 100 mg  100 mg Oral Daily Myriam Forehand, NP   100 mg at 09/15/23 1478   traZODone (DESYREL) tablet 50 mg  50 mg Oral QHS Myriam Forehand, NP   50 mg at 09/14/23 2108    Lab Results:  Results for orders placed or performed during the hospital encounter of 09/12/23 (from the past 48 hour(s))  CBC with Differential/Platelet     Status: Abnormal   Collection Time: 09/14/23  7:05 PM  Result Value Ref Range   WBC 9.0 4.0 - 10.5 K/uL   RBC 5.61 4.22 - 5.81 MIL/uL   Hemoglobin 12.9 (L) 13.0 - 17.0 g/dL   HCT 29.5 62.1 - 30.8 %   MCV 70.2 (L) 80.0 - 100.0 fL   MCH 23.0 (L) 26.0 - 34.0 pg   MCHC 32.7 30.0 - 36.0 g/dL   RDW 65.7 84.6 - 96.2 %   Platelets 193 150 - 400 K/uL   nRBC 0.0 0.0 - 0.2 %   Neutrophils Relative % 66 %   Neutro Abs 5.9 1.7 - 7.7 K/uL   Lymphocytes Relative 20 %   Lymphs Abs 1.8 0.7 - 4.0 K/uL   Monocytes Relative 10 %   Monocytes Absolute 0.9 0.1 - 1.0 K/uL   Eosinophils Relative 3 %   Eosinophils Absolute 0.3 0.0 - 0.5 K/uL   Basophils Relative 1 %   Basophils Absolute 0.1 0.0 - 0.1 K/uL   Immature Granulocytes 0 %   Abs Immature Granulocytes 0.03 0.00 - 0.07 K/uL    Comment: Performed at [facility]  Lab, [address] [place], [place] [address]  CBC     Status: Abnormal   Collection Time: 09/14/23  7:08 PM  Result Value Ref Range   WBC 8.9 4.0 - 10.5 K/uL   RBC 5.61  4.22 - 5.81 MIL/uL   Hemoglobin 12.6 (L) 13.0 - 17.0 g/dL   HCT 62.8 31.5 - 17.6 %   MCV 70.6 (L) 80.0 - 100.0 fL   MCH 22.5 (L) 26.0 - 34.0 pg   MCHC 31.8 30.0 - 36.0 g/dL   RDW 16.0 73.7 - 10.6 %   Platelets 193 150 - 400 K/uL   nRBC 0.0 0.0 - 0.2 %    Comment: Performed at [facility], [address] [place], [place] [address]  APTT     Status: None   Collection Time: 09/14/23  7:08 PM  Result Value Ref Range   aPTT 31 24 - 36 seconds    Comment: Performed at [facility], [address] [place], [place] [address]  Protime-INR     Status: None   Collection Time: 09/14/23  7:08 PM  Result Value Ref Range   Prothrombin Time 12.8 11.4 - 15.2 seconds   INR 0.9 0.8 - 1.2    Comment: (NOTE) INR goal varies based on device and disease states. Performed at [facility], [address] [place], [place] [address]   Type and screen [facility]     Status: None   Collection Time: 09/14/23  7:08 PM  Result Value Ref Range   ABO/RH(D) B POS    Antibody Screen NEG    Sample Expiration      09/17/2023,2359 Performed at [facility], [address] [place], [place] [address]   Hepatitis panel, acute     Status: None   Collection Time: 09/14/23  7:08 PM  Result Value Ref Range   Hepatitis B Surface Ag NON REACTIVE NON REACTIVE   HCV Ab NON REACTIVE NON REACTIVE    Comment: (NOTE) Nonreactive HCV antibody screen is consistent with no HCV infections,  unless recent infection is suspected or other evidence exists to indicate HCV infection.     Hep A IgM NON REACTIVE NON REACTIVE   Hep B C IgM NON REACTIVE NON REACTIVE    Comment: Performed at [facility], [address], [place], [place] [address]  CBC     Status: Abnormal   Collection Time: 09/15/23 12:55 AM  Result Value Ref Range   WBC 9.0 4.0 - 10.5 K/uL   RBC 5.20 4.22 - 5.81 MIL/uL   Hemoglobin 11.8 (L) 13.0 - 17.0 g/dL   HCT 16.9 (L) 67.8 - 93.8 %    MCV 69.6 (L) 80.0 - 100.0 fL   MCH 22.7 (L) 26.0 - 34.0 pg   MCHC 32.6 30.0 - 36.0 g/dL   RDW 10.1 75.1 - 02.5 %   Platelets 182 150 - 400 K/uL   nRBC 0.0 0.0 - 0.2 %    Comment: Performed at [facility], [address], [place], [place] [address]    Blood Alcohol level:  Lab Results  Component Value Date   [facility] <10 09/12/2023   ETH <5 06/03/2017    Musculoskeletal: Strength & Muscle Tone: within normal limits Gait & Station: normal Patient leans: N/A  Psychiatric Specialty Exam:  Presentation  General Appearance:  Appropriate for Environment; Neat  Eye Contact: Good  Speech: Clear and Coherent; Normal Rate  Speech Volume: Normal  Handedness: Left   Mood and Affect  Mood: Irritable; Anxious  Affect: Appropriate   Thought Process  Thought Processes: Coherent  Descriptions of Associations:Intact  Orientation:Full (Time, Place and Person) (and situation)  Thought Content:WDL  History of Schizophrenia/Schizoaffective disorder: none noted Duration of Psychotic Symptoms:none noted Hallucinations:Hallucinations: None  Ideas of Reference:None  Suicidal Thoughts:Suicidal Thoughts: No SI Passive Intent and/or Plan: -- (none noted)  Homicidal Thoughts:Homicidal Thoughts: No   Sensorium  Memory: Immediate Good; Remote Good  Judgment: Fair  Insight: Fair   Art therapist  Concentration: Good  Attention Span: Good  Recall: Good  Fund of Knowledge: Good  Language: Good   Psychomotor Activity  Psychomotor Activity: Psychomotor Activity: Normal   Assets  Assets: Communication Skills; Desire for Improvement; Housing; Health and safety inspector; Social Support   Sleep  Sleep: Sleep: Fair Number of Hours of Sleep: 6    Physical Exam: Physical Exam Neurological:     General: No focal deficit present.     Mental Status: He is oriented to person, place, and time. Mental status is at baseline.   Psychiatric:        Attention and Perception: Attention and perception normal.        Mood and Affect: Mood is anxious. Affect is flat.        Speech: Speech normal.        Behavior: Behavior normal. Behavior is cooperative.        Thought Content: Thought content normal.        Cognition and Memory: Cognition and memory normal.        Judgment: Judgment normal.    Review of Systems  Psychiatric/Behavioral:  Positive for substance abuse. The patient is nervous/anxious.   All other systems reviewed and are negative.  Blood pressure 125/80, pulse 75, temperature 98.6 F (37 C), temperature source Oral, resp. rate 18, height 5\' 6"  (1.676 m), weight 56.9 kg, SpO2 99%. Body mass index is 20.26 kg/m.   Treatment Plan Summary: Daily contact with patient to assess and evaluate symptoms and progress in treatment and Medication management Librium 25 mg q6 hours per CIWA protocol for withdrawal symptom management. Gabapentin 100 mg BID to aid with anxiety and alcohol withdrawal symptoms Continue Afrin as ordered Await ENT consult to assess any residual concerns related to nosebleeds. Education: Educate the patient on the importance of completing CIWA monitoring and awaiting ENT consult for safe discharge planning. Discharge Planning: Reassess patient readiness for discharge following ENT consult and CIWA completion. Trazodone 25 mg nightly to assist with sleep disturbances. Educated the patient on the potential dangers of chronic alcohol use, including risks of seizures and hallucinations. Initiated contact for Alcoholics Anonymous (AA) group participation and obtaining a sponsor. Arrange follow-up for discharge planning, including outpatient treatment and support in maintaining sobriety. Myriam Forehand, NP 09/15/2023, 3:50 PM

## 2023-09-15 NOTE — Plan of Care (Signed)
  Problem: Education: Goal: Knowledge of [facility] Education information/materials will improve Outcome: Progressing Goal: Emotional status will improve Outcome: Progressing Goal: Mental status will improve Outcome: Progressing Goal: Verbalization of understanding the information provided will improve Outcome: Progressing   Problem: Activity: Goal: Interest or engagement in activities will improve Outcome: Progressing Goal: Sleeping patterns will improve Outcome: Progressing   Problem: Coping: Goal: Ability to verbalize frustrations and anger appropriately will improve Outcome: Progressing Goal: Ability to demonstrate self-control will improve Outcome: Progressing   Problem: Health Behavior/Discharge Planning: Goal: Identification of resources available to assist in meeting health care needs will improve Outcome: Progressing Goal: Compliance with treatment plan for underlying cause of condition will improve Outcome: Progressing   Problem: Physical Regulation: Goal: Ability to maintain clinical measurements within normal limits will improve Outcome: Progressing   Problem: Safety: Goal: Periods of time without injury will increase Outcome: Progressing   Problem: Education: Goal: Knowledge of  General Education information/materials will improve Outcome: Progressing Goal: Emotional status will improve Outcome: Progressing Goal: Mental status will improve Outcome: Progressing Goal: Verbalization of understanding the information provided will improve Outcome: Progressing   Problem: Activity: Goal: Interest or engagement in activities will improve Outcome: Progressing Goal: Sleeping patterns will improve Outcome: Progressing   Problem: Coping: Goal: Ability to verbalize frustrations and anger appropriately will improve Outcome: Progressing Goal: Ability to demonstrate self-control will improve Outcome: Progressing   Problem: Health  Behavior/Discharge Planning: Goal: Identification of resources available to assist in meeting health care needs will improve Outcome: Progressing Goal: Compliance with treatment plan for underlying cause of condition will improve Outcome: Progressing   Problem: Physical Regulation: Goal: Ability to maintain clinical measurements within normal limits will improve Outcome: Progressing   Problem: Safety: Goal: Periods of time without injury will increase Outcome: Progressing   Problem: Education: Goal: Knowledge of disease or condition will improve Outcome: Progressing Goal: Understanding of discharge needs will improve Outcome: Progressing   Problem: Health Behavior/Discharge Planning: Goal: Ability to identify changes in lifestyle to reduce recurrence of condition will improve Outcome: Progressing Goal: Identification of resources available to assist in meeting health care needs will improve Outcome: Progressing   Problem: Physical Regulation: Goal: Complications related to the disease process, condition or treatment will be avoided or minimized Outcome: Progressing   Problem: Safety: Goal: Ability to remain free from injury will improve Outcome: Progressing

## 2023-09-15 NOTE — Progress Notes (Signed)
D- Patient alert and oriented x 4. Affect flat/mood congruent and irritable. He states he is upset with a conversation that took place yesterday about his substance use disorder. He is refusing his Gabapentin and Librium. He denies withdrawal symptoms- CIWA=2. Denies SI/ HI/ AVH. Patient denies pain. Patient denies depression and anxiety. His goals of the day are "making good decisions, eat better and make better judgements". "I want to be discharged". I don't want to take anything "that messes with my mind- I want to get back to work". Evening BP 164/100.PRN Hydralazine 25 mg administered.  A- Scheduled medications refused .Support and encouragement provided.  Routine safety checks conducted every 15 minutes without incident.  Patient informed to notify staff with problems or concerns and verbalizes understanding. R- No adverse drug reactions noted.  Patient non compliant with detox medications but follows  treatment plan. Patient receptive and cooperative. He interacts well with others on the unit.  Patient contracts for safety and  remains safe on the unit at this time.

## 2023-09-15 NOTE — Group Note (Signed)
Date:  09/15/2023 Time:  9:59 PM  Group Topic/Focus:  Making Healthy Choices:   The focus of this group is to help patients identify negative/unhealthy choices they were using prior to admission and identify positive/healthier coping strategies to replace them upon discharge.    Participation Level:  Active  Participation Quality:  Appropriate and Attentive  Affect:  Appropriate and Excited  Cognitive:  Alert and Appropriate  Insight: Appropriate, Good, and Improving  Engagement in Group:  Developing/Improving and Engaged  Modes of Intervention:  Activity, Clarification, Discussion, Education, Problem-solving, Rapport Building, Role-play, Socialization, and Support  Additional Comments:     Shakendra Griffeth 09/15/2023, 9:59 PM

## 2023-09-16 DIAGNOSIS — F1020 Alcohol dependence, uncomplicated: Secondary | ICD-10-CM | POA: Diagnosis not present

## 2023-09-16 MED ORDER — HYDROXYZINE HCL 25 MG PO TABS: 25.0000 mg | ORAL_TABLET | Freq: Three times a day (TID) | ORAL | 0 refills | Status: AC | PRN

## 2023-09-16 MED ORDER — TOPIRAMATE 25 MG PO TABS: 25.0000 mg | ORAL_TABLET | Freq: Every day | ORAL | 1 refills | Status: AC

## 2023-09-16 MED ORDER — NICOTINE POLACRILEX 2 MG MT GUM: 2.0000 mg | CHEWING_GUM | OROMUCOSAL | 0 refills | Status: AC | PRN

## 2023-09-16 MED ORDER — TOPIRAMATE 25 MG PO TABS: 25.0000 mg | ORAL_TABLET | Freq: Every day | ORAL | 1 refills | Status: DC

## 2023-09-16 MED ORDER — SALINE SPRAY 0.65 % NA SOLN: 1.0000 | NASAL | 1 refills | Status: AC | PRN

## 2023-09-16 MED ORDER — OXYMETAZOLINE HCL 0.05 % NA SOLN: 1.0000 | Freq: Two times a day (BID) | NASAL | Status: AC | PRN

## 2023-09-16 MED ORDER — FOLIC ACID 1 MG PO TABS: 1.0000 mg | ORAL_TABLET | Freq: Every day | ORAL | 0 refills | Status: AC

## 2023-09-16 MED ORDER — TOPAMAX 25 MG PO TABS: 25.0000 mg | ORAL_TABLET | Freq: Every day | ORAL | 1 refills | Status: DC

## 2023-09-16 NOTE — Progress Notes (Signed)
  [facility] Adult Case Management Discharge Plan :  Will you be returning to the same living situation after discharge:  Yes,  pt reports that he is going home. At discharge, do you have transportation home?: Yes,  pt reports that girlfriend will provide transportation home. Do you have the ability to pay for your medications: Yes,  BLUE CROSS BLUE SHIELD / BCBS COMM PPO  Release of information consent forms completed and in the chart;  Patient's signature needed at discharge.  Patient to Follow up at:  Follow-up Information     [facility] Follow up.   Why: Walk in hours are from 8AM to [facility] Monday, Wednesday and Friday Contact information: [address] [place] [address] [phone]                 Next level of care provider has access to [facility] Link:no  Safety Planning and Suicide Prevention discussed: Yes,  SPE was completed with the patient.      Has patient been referred to the Quitline?: Patient refused referral for treatment  Patient has been referred for addiction treatment: Patient refused referral for treatment.  Harden Mo, LCSW 09/16/2023, 11:06 AM

## 2023-09-16 NOTE — BHH Suicide Risk Assessment (Addendum)
[facility] Discharge Suicide Risk Assessment   Principal Problem: Alcohol use disorder, severe, dependence (HCC) Discharge Diagnoses: Principal Problem:   Alcohol use disorder, severe, dependence (HCC) Active Problems:   Bleeding nose   Abnormal LFTs   Leucocytosis   Elevated blood pressure reading   Polysubstance abuse (HCC)   Anxiety   Total Time spent with patient: 1.5 hours  Musculoskeletal: Strength & Muscle Tone: within normal limits Gait & Station: normal Patient leans: N/A  Psychiatric Specialty Exam  Presentation  General Appearance:  Appropriate for Environment; Neat  Eye Contact: Good  Speech: Clear and Coherent  Speech Volume: Normal  Handedness: Right   Mood and Affect  Mood: Euthymic  Duration of Depression Symptoms: 2 weeks Affect: Appropriate; Congruent   Thought Process  Thought Processes: Coherent; Goal Directed  Descriptions of Associations:Intact  Orientation:Full (Time, Place and Person) (and situation)  Thought Content:WDL  History of Schizophrenia/Schizoaffective disorder:none recorded Duration of Psychotic Symptoms: none recorded Hallucinations:Hallucinations: None  Ideas of Reference:None  Suicidal Thoughts:Suicidal Thoughts: No SI Passive Intent and/or Plan: -- (denies)  Homicidal Thoughts:Homicidal Thoughts: No   Sensorium  Memory: Immediate Good; Remote Good  Judgment: Good  Insight: Fair   Art therapist  Concentration: Good  Attention Span: Good  Recall: Good  Fund of Knowledge: Good  Language: Good   Psychomotor Activity  Psychomotor Activity: Psychomotor Activity: Normal   Assets  Assets: Communication Skills; Financial Resources/Insurance; Housing; Social Support   Sleep  Sleep: Sleep: Good Number of Hours of Sleep: 8   Physical Exam: Physical Exam Vitals and nursing note reviewed.  Constitutional:      Appearance: Normal appearance.  HENT:     Head: Normocephalic.      Nose: Nose normal.  Pulmonary:     Effort: Pulmonary effort is normal.  Musculoskeletal:        General: Normal range of motion.     Cervical back: Normal range of motion.  Neurological:     Mental Status: He is alert.  Psychiatric:        Attention and Perception: Attention and perception normal.        Mood and Affect: Mood and affect normal.        Speech: Speech normal.        Behavior: Behavior normal. Behavior is cooperative.        Thought Content: Thought content normal.        Cognition and Memory: Cognition and memory normal.        Judgment: Judgment normal.    ROS Blood pressure 127/82, pulse 71, temperature 97.7 F (36.5 C), temperature source Oral, resp. rate 16, height 5\' 6"  (1.676 m), weight 56.9 kg, SpO2 98%. Body mass index is 20.26 kg/m.  Mental Status Per Nursing Assessment::   On Admission:  NA  Demographic Factors:  Male and Low socioeconomic status  Loss Factors: NA  Historical Factors: Impulsivity  Risk Reduction Factors:   Responsible for children under 64 years of age, Sense of responsibility to family, Religious beliefs about death, Employed, Living with another person, especially a relative, Positive social support, and Positive coping skills or problem solving skills  Continued Clinical Symptoms:  Depression:   Impulsivity Insomnia Alcohol/Substance Abuse/Dependencies  Cognitive Features That Contribute To Risk:  None    Suicide Risk:  Minimal: No identifiable suicidal ideation.  Patients presenting with no risk factors but with morbid ruminations; may be classified as minimal risk based on the severity of the depressive symptoms   Follow-up Information  [facility] Follow up.   Why: Walk in hours are from 8AM to [facility] Monday, Wednesday and Friday Contact information: [address] [place] [address] [phone]                 Plan Of Care/Follow-up recommendations:  Activity:  as  tolerated Diet:  heart healthy Monitor liver function and reassess AST and bilirubin levels with PCP Outpatient Treatment: Referral to [facility] in [place], [place]. Walk-In Hours: 8 AM to 2 PM on Mondays, Wednesdays, and Fridays Contact Information: [address] [place], [place] [address], Phone: [phone] Encourage attendance at Alcoholics Anonymous (AA) or other supportive group therapy for ongoing sobriety. Provide resources for coping with work-related stress and loneliness to reduce relapse risk. Topiramate 50 mg for reduce cravings and decrease heavy drinking days. Myriam Forehand, NP 09/16/2023, 12:12 PM

## 2023-09-16 NOTE — Progress Notes (Signed)
Patient ID: Warren Cherry, male   DOB: January 24, 1991, 32 y.o.   MRN: [id number]  Patient was discharged from the Behavioral Medicine Unit at approx. 1435 escorted by staff. Patient denies SI/HI/AVH. Discharge packet to include printed AVS, Suicide Risk Assessment, and Transition Record reviewed with patient. Belongings returned. Suicide safety plan completed with a copy kept in chart.

## 2023-09-16 NOTE — Plan of Care (Signed)
.  D- Patient alert and oriented.Denies SI, HI, AVH, and pain.  A- Scheduled medications administered to patient, per MD orders. Support and encouragement provided.  Routine safety checks conducted every 15 minutes.  Patient informed to notify staff with problems or concerns. R- No adverse drug reactions noted. Patient contracts for safety at this time. Patient compliant with medications and treatment plan. Patient receptive, calm, and cooperative. Patient interacts well with others on the unit.  Patient remains safe at this time.  Problem: Education: Goal: Knowledge of [facility] Education information/materials will improve Outcome: Progressing Goal: Emotional status will improve Outcome: Progressing Goal: Mental status will improve Outcome: Progressing

## 2023-09-16 NOTE — Plan of Care (Signed)
  Problem: Education: Goal: Knowledge of [facility] Education information/materials will improve Outcome: Progressing Goal: Emotional status will improve Outcome: Progressing   

## 2023-09-16 NOTE — Discharge Summary (Signed)
Physician Discharge Summary Note  Patient:  Warren Cherry is an 32 y.o., male MRN:  [id number] DOB:  1991-08-14 Patient phone:  [phone] (home)  Patient address:   [address] [place] [address],  Total Time spent with patient: 2 hours  Date of Admission:  09/12/2023 Date of Discharge: 09/16/2023  Reason for Admission: 32 year old Pacific Islander male Alcohol use disorder with recent episode of suicidal ideation (SI) and homicidal ideation (HI) following heavy intoxication. Brought to the ER by his girlfriend due to threats made while intoxicated.Patient admitted voluntarily after an alcohol-related incident where he became heavily intoxicated, expressed suicidal thoughts, and reportedly threatened his girlfriend. He does not recall these events but acknowledges excessive alcohol use that has affected his physical health, mental stability, and relationships. 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 States he experiences work stress and loneliness, which trigger his drinking behavior. Patient also admitted to occasional driving while intoxicated and has never attempted sobriety or detoxification treatment previously.  Principal Problem: Alcohol use disorder, severe, dependence (HCC) Discharge Diagnoses: Principal Problem:   Alcohol use disorder, severe, dependence (HCC) Active Problems:   Bleeding nose   Abnormal LFTs   Leucocytosis   Elevated blood pressure reading   Polysubstance abuse (HCC)   Anxiety   Past Psychiatric History: ETOH abuse  Past Medical History:  Past Medical History:  Diagnosis Date   ETOH abuse     Past Surgical History:  Procedure Laterality Date   EAR CANALOPLASTY     Family History:  Family History  Problem Relation Age of Onset   Hypertension Father    Family Psychiatric  History: see  above Social History:  Social History   Substance and Sexual Activity  Alcohol Use Yes     Social History   Substance and Sexual Activity  Drug Use No    Social History   Socioeconomic History   Marital status: Single    Spouse name: Not on file   Number of children: Not on file   Years of education: Not on file   Highest education level: Not on file  Occupational History   Not on file  Tobacco Use   Smoking status: Former    Current packs/day: 0.00    Types: Cigarettes    Quit date: 2021    Years since quitting: 3.8   Smokeless tobacco: Never  Vaping Use   Vaping status: Every Day  Substance and Sexual Activity   Alcohol use: Yes   Drug use: No   Sexual activity: Not on file  Other Topics Concern   Not on file  Social History Narrative   Not on file   Social Determinants of Health   Financial Resource Strain: Not on file  Food Insecurity: No Food Insecurity (09/12/2023)   Hunger Vital Sign    Worried About Running Out of Food in the Last Year: Never true    Ran Out of Food in the Last Year: Never true  Transportation Needs: No Transportation Needs (09/12/2023)   PRAPARE - Administrator, Civil Service (Medical): No    Lack of Transportation (Non-Medical): No  Physical Activity: Not on file  Stress: Not on file  Social Connections: Not on file    Hospital Course:  Patient arrived disheveled and distressed but engaged in conversation. Reported heavy, daily alcohol consumption (12-18 beers) due  to work stress and isolation in hotel stays. Denied depression but acknowledged stress triggers related to work and loneliness. Labs: Initial labs showed elevated liver enzymes (AST of 78, bilirubin of 1.4), suggesting possible liver damage from prolonged alcohol use. UDS was positive for cocaine, which the patient did not recall using recently.CIWA protocol initiated for withdrawal management, with Ativan/Librium administered as needed. Patient educated on  withdrawal symptoms and treatment process.Patient displayed mild withdrawal symptoms, including hand and head tremors, but denied nausea, vomiting, or severe discomfort.Psychiatric evaluation showed patient oriented and cooperative, with insight into his alcohol dependence and the need for help. Denied current SI, HI, and AVH.Counseling sessions focused on identifying stressors and setting goals for sobriety. The patient expressed a desire to improve his health for his daughter and family. Symptoms managed effectively with CIWA protocol, showing gradual improvement with decreasing tremors and no adverse reactions to medication.He reported frequent nosebleeds and persistently elevated blood pressure.ENT and cardiology consults were requested to evaluate the cause of nosebleeds and manage hypertensive episodes.he patient's blood pressure stabilized, and his nosebleeds decreased with treatment. He was provided with resources and a discharge plan for continued outpatient treatment for alcohol use disorder and follow-up for hypertension management.  Physical Findings: AIMS:  , ,  ,  ,    CIWA:  CIWA-Ar Total: 2 COWS:     Musculoskeletal: Strength & Muscle Tone: within normal limits Gait & Station: normal Patient leans: N/A   Psychiatric Specialty Exam:  Presentation  General Appearance:  Appropriate for Environment; Neat  Eye Contact: Good  Speech: Clear and Coherent  Speech Volume: Normal  Handedness: Right   Mood and Affect  Mood: Euthymic  Affect: Appropriate; Congruent   Thought Process  Thought Processes: Coherent; Goal Directed  Descriptions of Associations:Intact  Orientation:Full (Time, Place and Person) (and situation)  Thought Content:WDL  History of Schizophrenia/Schizoaffective disorder:No data recorded Duration of Psychotic Symptoms:No data recorded Hallucinations:Hallucinations: None  Ideas of Reference:None  Suicidal Thoughts:Suicidal Thoughts: No SI  Passive Intent and/or Plan: -- (denies)  Homicidal Thoughts:Homicidal Thoughts: No   Sensorium  Memory: Immediate Good; Remote Good  Judgment: Good  Insight: Fair   Art therapist  Concentration: Good  Attention Span: Good  Recall: Good  Fund of Knowledge: Good  Language: Good   Psychomotor Activity  Psychomotor Activity: Psychomotor Activity: Normal   Assets  Assets: Communication Skills; Financial Resources/Insurance; Housing; Social Support   Sleep  Sleep: Sleep: Good Number of Hours of Sleep: 8    Physical Exam: Physical Exam Vitals and nursing note reviewed.  Constitutional:      Appearance: Normal appearance.  HENT:     Head: Normocephalic and atraumatic.     Nose: Nose normal.  Pulmonary:     Effort: Pulmonary effort is normal.  Musculoskeletal:        General: Normal range of motion.     Cervical back: Normal range of motion.  Neurological:     General: No focal deficit present.     Mental Status: He is alert and oriented to person, place, and time.  Psychiatric:        Attention and Perception: Attention and perception normal.        Mood and Affect: Mood and affect normal.        Speech: Speech normal.        Behavior: Behavior normal. Behavior is cooperative.        Thought Content: Thought content normal.        Cognition and Memory:  Cognition and memory normal.        Judgment: Judgment normal.    Review of Systems  All other systems reviewed and are negative.  Blood pressure 127/82, pulse 71, temperature 97.7 F (36.5 C), temperature source Oral, resp. rate 16, height 5\' 6"  (1.676 m), weight 56.9 kg, SpO2 98%. Body mass index is 20.26 kg/m.   Social History   Tobacco Use  Smoking Status Former   Current packs/day: 0.00   Types: Cigarettes   Quit date: 2021   Years since quitting: 3.8  Smokeless Tobacco Never   Tobacco Cessation:  A prescription for an FDA-approved tobacco cessation medication provided at  discharge   Blood Alcohol level:  Lab Results  Component Value Date   ETH <10 09/12/2023   ETH <5 06/03/2017    Metabolic Disorder Labs:  No results found for: "HGBA1C", "MPG" No results found for: "PROLACTIN" No results found for: "CHOL", "TRIG", "HDL", "CHOLHDL", "VLDL", "LDLCALC"  See Psychiatric Specialty Exam and Suicide Risk Assessment completed by Attending Physician prior to discharge.  Discharge destination:  Home  Is patient on multiple antipsychotic therapies at discharge:  No   Has Patient had three or more failed trials of antipsychotic monotherapy by history:  No  Recommended Plan for Multiple Antipsychotic Therapies: NA   Allergies as of 09/16/2023       Reactions   Gramineae Pollens Other (See Comments)   Unknown         Medication List     TAKE these medications      Indication  folic acid 1 MG tablet Commonly known as: FOLVITE Take 1 tablet (1 mg total) by mouth daily. Start taking on: September 17, 2023  Indication: Anemia From Inadequate Folic Acid   hydrOXYzine 25 MG tablet Commonly known as: ATARAX Take 1 tablet (25 mg total) by mouth every 8 (eight) hours as needed for anxiety (or CIWA score </= 10).  Indication: Feeling Anxious   nicotine polacrilex 2 MG gum Commonly known as: NICORETTE Take 1 each (2 mg total) by mouth as needed for smoking cessation.  Indication: Nicotine Addiction   oxymetazoline 0.05 % nasal spray Commonly known as: AFRIN Place 1 spray into both nostrils 2 (two) times daily as needed for congestion.  Indication: Stuffy Nose   sodium chloride 0.65 % Soln nasal spray Commonly known as: OCEAN Place 1 spray into both nostrils as needed for congestion.  Indication: Nasal Decongestion   Topamax 25 MG tablet Generic drug: topiramate Take 1 tablet (25 mg total) by mouth at bedtime.  Indication: Abuse or Misuse of Alcohol        Follow-up Information     [facility] Follow up.   Why: Walk  in hours are from 8AM to [facility] Monday, Wednesday and Friday Contact information: [address] [place] [address] [phone]                 Follow-up recommendations:  Activity:  as tolerated Diet:  heart healthy  Comments:   Monitor liver function and reassess AST and bilirubin levels with PCP Outpatient Treatment: Referral to [facility] in [place], [place]. Walk-In Hours: 8 AM to 2 PM on Mondays, Wednesdays, and Fridays Contact Information: [address], [place], [place] [address], Phone: [phone] Encourage attendance at Alcoholics Anonymous (AA) or other supportive group therapy for ongoing sobriety. Provide resources for coping with work-related stress and loneliness to reduce relapse risk. Topiramate 50 mg for reduce cravings and decrease heavy  drinking days. Vistaril 25mg  as needed for anxiety  Signed: Myriam Forehand, NP 09/16/2023, 12:49 PM

## 2023-09-16 NOTE — Group Note (Signed)
Recreation Therapy Group Note   Group Topic:Leisure Education  Group Date: 09/16/2023 Start Time: 1100 End Time: 1200 Facilitators: Rosina Lowenstein, LRT, CTRS Location:  Craft Room  Group Description: Leisure. Patients were given the option to choose from singing karaoke, coloring mandalas, using oil pastels, journaling, or playing with play-doh. LRT and pts discussed the meaning of leisure, the importance of participating in leisure during their free time/when they're outside of the hospital, as well as how our leisure interests can also serve as coping skills.    Goal Area(s) Addressed:  Patient will identify a current leisure interest.  Patient will learn the definition of "leisure". Patient will practice making a positive decision. Patient will have the opportunity to try a new leisure activity. Patient will communicate with peers and LRT.    Affect/Mood: Appropriate   Participation Level: Active and Engaged   Participation Quality: Independent   Behavior: Calm and Cooperative   Speech/Thought Process: Coherent   Insight: Good   Judgement: Good   Modes of Intervention: Activity   Patient Response to Interventions:  Attentive, Engaged, Interested , and Receptive   Education Outcome:  Acknowledges education   Clinical Observations/Individualized Feedback: Cristina was active in their participation of session activities and group discussion. Pt identified "workout and read" as things he does in his free time. Pt chose to color mandalas and with oil pastels. Pt interacted well with LRT and peers duration of session.   Plan: Continue to engage patient in RT group sessions 2-3x/week.   Rosina Lowenstein, LRT, CTRS 09/16/2023 12:53 PM

## 2023-09-16 NOTE — [facility] Suicide Risk Assessment (Signed)
[facility] INPATIENT:  Family/Significant Other Suicide Prevention Education  Suicide Prevention Education:  Patient Refusal for Family/Significant Other Suicide Prevention Education: The patient Warren Cherry has refused to provide written consent for family/significant other to be provided Family/Significant Other Suicide Prevention Education during admission and/or prior to discharge.  Physician notified.  Harden Mo 09/16/2023, 1:00 PM

## 2024-06-26 NOTE — Telephone Encounter (Signed)
 8/12 called pt to schedule appt. Patient did not answer, number says it is no longer available. Will attempt to contact again next week.

## 2024-09-26 NOTE — Telephone Encounter (Signed)
 11/12 called pt again to schedule appointment. Pt did not answer and voicemail box was unavailable, says number is no longer available.

## 2024-11-09 ENCOUNTER — Emergency Department (HOSPITAL_COMMUNITY)
Admission: EM | Admit: 2024-11-09 | Discharge: 2024-11-09 | Disposition: A | Discharge status: Home / Self Care | Attending: Emergency Medicine | Admitting: Emergency Medicine

## 2024-11-09 ENCOUNTER — Encounter (HOSPITAL_COMMUNITY): Payer: Self-pay

## 2024-11-09 ENCOUNTER — Other Ambulatory Visit: Payer: Self-pay

## 2024-11-09 ENCOUNTER — Emergency Department (HOSPITAL_COMMUNITY)

## 2024-11-09 DIAGNOSIS — R4189 Other symptoms and signs involving cognitive functions and awareness: Secondary | ICD-10-CM | POA: Diagnosis not present

## 2024-11-09 DIAGNOSIS — R4182 Altered mental status, unspecified: Secondary | ICD-10-CM | POA: Diagnosis present

## 2024-11-09 LAB — URINE DRUG SCREEN
Amphetamines: NEGATIVE
Barbiturates: NEGATIVE
Benzodiazepines: POSITIVE — AB
Cocaine: NEGATIVE
Fentanyl: NEGATIVE
Methadone Scn, Ur: NEGATIVE
Opiates: NEGATIVE
Tetrahydrocannabinol: NEGATIVE

## 2024-11-09 LAB — COMPREHENSIVE METABOLIC PANEL WITH GFR
ALT: 14 U/L (ref 0–44)
AST: 22 U/L (ref 15–41)
Albumin: 4.5 g/dL (ref 3.5–5.0)
Alkaline Phosphatase: 32 U/L — ABNORMAL LOW (ref 38–126)
Anion gap: 13 (ref 5–15)
BUN: 13 mg/dL (ref 6–20)
CO2: 23 mmol/L (ref 22–32)
Calcium: 9.2 mg/dL (ref 8.9–10.3)
Chloride: 101 mmol/L (ref 98–111)
Creatinine, Ser: 1.11 mg/dL (ref 0.61–1.24)
GFR, Estimated: >60 mL/min
Glucose, Bld: 100 mg/dL — ABNORMAL HIGH (ref 70–99)
Potassium: 4 mmol/L (ref 3.5–5.1)
Sodium: 137 mmol/L (ref 135–145)
Total Bilirubin: 1.2 mg/dL (ref 0.0–1.2)
Total Protein: 7.1 g/dL (ref 6.5–8.1)

## 2024-11-09 LAB — MAGNESIUM: Magnesium: 1.9 mg/dL (ref 1.7–2.4)

## 2024-11-09 LAB — ACETAMINOPHEN LEVEL: Acetaminophen (Tylenol), Serum: <10 ug/mL — ABNORMAL LOW (ref 10–30)

## 2024-11-09 LAB — CBC WITH DIFFERENTIAL/PLATELET
Abs Immature Granulocytes: 0.02 K/uL (ref 0.00–0.07)
Basophils Absolute: 0.1 K/uL (ref 0.0–0.1)
Basophils Relative: 1 %
Eosinophils Absolute: 0.2 K/uL (ref 0.0–0.5)
Eosinophils Relative: 2 %
HCT: 46.9 % (ref 39.0–52.0)
Hemoglobin: 14.6 g/dL (ref 13.0–17.0)
Immature Granulocytes: 0 %
Lymphocytes Relative: 27 %
Lymphs Abs: 1.8 K/uL (ref 0.7–4.0)
MCH: 22.3 pg — ABNORMAL LOW (ref 26.0–34.0)
MCHC: 31.1 g/dL (ref 30.0–36.0)
MCV: 71.7 fL — ABNORMAL LOW (ref 80.0–100.0)
Monocytes Absolute: 0.5 K/uL (ref 0.1–1.0)
Monocytes Relative: 7 %
Neutro Abs: 4.1 K/uL (ref 1.7–7.7)
Neutrophils Relative %: 63 %
Platelets: 184 K/uL (ref 150–400)
RBC: 6.54 MIL/uL — ABNORMAL HIGH (ref 4.22–5.81)
RDW: 16.7 % — ABNORMAL HIGH (ref 11.5–15.5)
WBC: 6.6 K/uL (ref 4.0–10.5)
nRBC: 0 % (ref 0.0–0.2)

## 2024-11-09 LAB — URINALYSIS, ROUTINE W REFLEX MICROSCOPIC
Bilirubin Urine: NEGATIVE
Glucose, UA: NEGATIVE mg/dL
Hgb urine dipstick: NEGATIVE
Ketones, ur: 5 mg/dL — AB
Leukocytes,Ua: NEGATIVE
Nitrite: NEGATIVE
Protein, ur: NEGATIVE mg/dL
Specific Gravity, Urine: 1.026 (ref 1.005–1.030)
pH: 5 (ref 5.0–8.0)

## 2024-11-09 LAB — AMMONIA: Ammonia: 24 umol/L (ref 9–35)

## 2024-11-09 LAB — RESP PANEL BY RT-PCR (RSV, FLU A&B, COVID)  RVPGX2
Influenza A by PCR: NEGATIVE
Influenza B by PCR: NEGATIVE
Resp Syncytial Virus by PCR: NEGATIVE
SARS Coronavirus 2 by RT PCR: NEGATIVE

## 2024-11-09 LAB — ETHANOL: Alcohol, Ethyl (B): <15 mg/dL

## 2024-11-09 NOTE — ED Notes (Signed)
 Pt provided with urinal

## 2024-11-09 NOTE — ED Provider Notes (Signed)
 " [place] [facility] AT [facility] Provider Note   CSN: [id number] Arrival date & time: 11/09/24  1059     Patient presents with: Drug Overdose   Warren Cherry is a 33 y.o. male here from [facility] with concern for AMS.  Reportedly patient being treated for etoh withdrawal and receiving valium, given under observation. Found unresponsive this morning.  Given narcan by EMS and 250 cc IV fluid, no significant change in mental status.  Pt awake and nods yes/no to questions and whispers but difficult to understand.    HPI     Prior to Admission medications  Medication Sig Start Date End Date Taking? Authorizing Provider  hydrOXYzine  (ATARAX ) 25 MG tablet Take 1 tablet (25 mg total) by mouth every 8 (eight) hours as needed for anxiety (or CIWA score </= 10). 09/16/23 10/16/23  Nicholaus Brad RAMAN, NP  sodium chloride (OCEAN) 0.65 % SOLN nasal spray Place 1 spray into both nostrils as needed for congestion. 09/16/23 10/16/23  Nicholaus Brad RAMAN, NP  topiramate  (TOPAMAX ) 25 MG tablet Take 1 tablet (25 mg total) by mouth daily. 09/16/23 09/15/24  Nicholaus Brad RAMAN, NP    Allergies: Gramineae pollens    Review of Systems  Updated Vital Signs BP 129/79   Pulse (!) 59   Temp 98.1 F (36.7 C) (Oral)   Resp 15   SpO2 100%   Physical Exam Constitutional:      General: He is not in acute distress. HENT:     Head: Normocephalic and atraumatic.  Eyes:     Conjunctiva/sclera: Conjunctivae normal.     Pupils: Pupils are equal, round, and reactive to light.  Cardiovascular:     Rate and Rhythm: Normal rate and regular rhythm.  Pulmonary:     Effort: Pulmonary effort is normal. No respiratory distress.  Abdominal:     General: There is no distension.     Tenderness: There is no abdominal tenderness.  Skin:    General: Skin is warm and dry.  Neurological:     Mental Status: He is alert.     Comments: Moving all extremities, difficult to assess verbal status (whispering), no gaze  deviation or facial droop     (all labs ordered are listed, but only abnormal results are displayed) Labs Reviewed  CBC WITH DIFFERENTIAL/PLATELET - Abnormal; Notable for the following components:      Result Value   RBC 6.54 (*)    MCV 71.7 (*)    MCH 22.3 (*)    RDW 16.7 (*)    All other components within normal limits  URINALYSIS, ROUTINE W REFLEX MICROSCOPIC - Abnormal; Notable for the following components:   Color, Urine AMBER (*)    APPearance HAZY (*)    Ketones, ur 5 (*)    All other components within normal limits  URINE DRUG SCREEN - Abnormal; Notable for the following components:   Benzodiazepines POSITIVE (*)    All other components within normal limits  ACETAMINOPHEN  LEVEL - Abnormal; Notable for the following components:   Acetaminophen  (Tylenol ), Serum <10 (*)    All other components within normal limits  COMPREHENSIVE METABOLIC PANEL WITH GFR - Abnormal; Notable for the following components:   Glucose, Bld 100 (*)    Alkaline Phosphatase 32 (*)    All other components within normal limits  RESP PANEL BY RT-PCR (RSV, FLU A&B, COVID)  [id number]  ETHANOL  MAGNESIUM   AMMONIA    EKG: None  Radiology: CT Head Wo Contrast  Result Date: 11/09/2024 EXAM: CT HEAD WITHOUT CONTRAST 11/09/2024 12:34:55 PM TECHNIQUE: CT of the head was performed without the administration of intravenous contrast. Automated exposure control, iterative reconstruction, and/or weight based adjustment of the mA/kV was utilized to reduce the radiation dose to as low as reasonably achievable. COMPARISON: CT head 01/20/2014. CLINICAL HISTORY: Mental status change, unknown cause. FINDINGS: BRAIN AND VENTRICLES: Unchanged focal hypoattenuation in the subcortical white matter underlying the left parietal lobe. Unchanged encephalomalacia along the anterior left frontal lobe, likely sequela of prior trauma. No acute hemorrhage. No evidence of acute infarct. No hydrocephalus. No extra-axial collection. No  mass effect or midline shift. ORBITS: No acute abnormality. SINUSES: Mucosal thickening in ethmoid air cells. SOFT TISSUES AND SKULL: No acute soft tissue abnormality. No skull fracture. IMPRESSION: 1. No acute intracranial abnormality. Electronically signed by: Ryan Chess MD 11/09/2024 12:44 PM EST RP Workstation: [id number]     Procedures   Medications Ordered in the ED - No data to display  Clinical Course as of 11/10/24 0804  Fri Nov 09, 2024  1404 Patient reassessed and appears more alert.  He tells me that my whole body hurts.  He states he has numbness in his hands and feet.  He has an excellent pulse and brisk cap refills in his hands and feet, pedal and radial.  He seems much more alert.  I do not see a clear cause for the symptoms, and I wonder whether there may be some issue of secondary gain.  There is also possibly some viral type syndrome.  But I think he is stable for discharge back to prison at this time [MT]    Clinical Course User Index [MT] Shron Ozer, Donnice PARAS, MD                                 Medical Decision Making Amount and/or Complexity of Data Reviewed Labs: ordered. Radiology: ordered. ECG/medicine tests: ordered.   This patient presents to the Emergency Department with complaint of altered mental status.  This involves an extensive number of treatment options, and is a complaint that carries with it a high risk of complications and morbidity.  The differential diagnosis includes hypoglycemia vs metabolic encephalopathy vs infection (including cystitis) vs ICH vs stroke vs polypharmacy vs other  I ordered, reviewed, and interpreted labs, including no emergent findings I ordered imaging studies which included CT head I independently visualized and interpreted imaging which showed no emergent findings and the monitor tracing which showed sinus rhythm I personally reviewed the patients ECG which showed sinus rhythm with no acute ischemic findings  After the  interventions stated above, I reevaluated the patient and found improved mental status.  Patient was awake.  He was able to talk to me as noted in ED course, still unable to provide a clear history about what happened earlier.  I did speak to him in private with his officer outside the room to update him about his results.  He is cleared for discharge      Final diagnoses:  Unresponsiveness    ED Discharge Orders     None          Rory Montel, Donnice PARAS, MD 11/10/24 [phone]  "

## 2024-11-09 NOTE — Discharge Instructions (Addendum)
 Warren Cherry had blood tests, an ECG and a CT scan of his brain.  These did not show any emergency cause of his symptoms, including heart attack or stroke.  His flu and COVID test were negative.  His blood tests were within normal limits.

## 2024-11-09 NOTE — ED Triage Notes (Signed)
 Pt BIBA from [facility] jail for possible OD. 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 Pt received 4 mg narcan at facility w/ no response. EMS admin 250 ml NS and 4mg  zofran  PTA. Pt responsive to voice on arrival but is not speaking. VSS

## 2024-11-09 NOTE — ED Notes (Signed)
 Pt provided with discharge and follow up instructions, medications discussed, pt verbalized understanding. LDA removed, VSS, pt ambulatory out of ED w/ all paperwork in custody of [place]
